# Patient Record
Sex: Female | Born: 1935
Health system: Southern US, Community
[De-identification: ages and names within clinical notes are randomized; demographics above are authoritative.]

## PROBLEM LIST (undated history)

## (undated) DIAGNOSIS — C50919 Malignant neoplasm of unspecified site of unspecified female breast: Secondary | ICD-10-CM

## (undated) DIAGNOSIS — Z853 Personal history of malignant neoplasm of breast: Secondary | ICD-10-CM

## (undated) DIAGNOSIS — E785 Hyperlipidemia, unspecified: Secondary | ICD-10-CM

## (undated) DIAGNOSIS — C801 Malignant (primary) neoplasm, unspecified: Secondary | ICD-10-CM

## (undated) DIAGNOSIS — G4730 Sleep apnea, unspecified: Secondary | ICD-10-CM

## (undated) DIAGNOSIS — I10 Essential (primary) hypertension: Secondary | ICD-10-CM

## (undated) DIAGNOSIS — G2581 Restless legs syndrome: Secondary | ICD-10-CM

## (undated) DIAGNOSIS — L409 Psoriasis, unspecified: Secondary | ICD-10-CM

## (undated) DIAGNOSIS — K219 Gastro-esophageal reflux disease without esophagitis: Secondary | ICD-10-CM

## (undated) DIAGNOSIS — G709 Myoneural disorder, unspecified: Secondary | ICD-10-CM

## (undated) HISTORY — DX: Restless legs syndrome: G25.81

## (undated) HISTORY — DX: Myoneural disorder, unspecified: G70.9

## (undated) HISTORY — DX: Malignant (primary) neoplasm, unspecified: C80.1

## (undated) HISTORY — PX: TUBAL LIGATION: SHX77

## (undated) HISTORY — DX: Gastro-esophageal reflux disease without esophagitis: K21.9

## (undated) HISTORY — DX: Hyperlipidemia, unspecified: E78.5

## (undated) HISTORY — DX: Personal history of malignant neoplasm of breast: Z85.3

## (undated) HISTORY — DX: Essential (primary) hypertension: I10

---

## 1984-04-30 HISTORY — PX: APPENDECTOMY: SHX54

## 1998-01-17 ENCOUNTER — Ambulatory Visit (HOSPITAL_COMMUNITY): Admission: RE | Admit: 1998-01-17 | Discharge: 1998-01-17 | Payer: Self-pay | Admitting: Family Medicine

## 1998-05-31 ENCOUNTER — Ambulatory Visit (HOSPITAL_COMMUNITY): Admission: RE | Admit: 1998-05-31 | Discharge: 1998-05-31 | Payer: Self-pay | Admitting: Family Medicine

## 1999-03-03 ENCOUNTER — Ambulatory Visit (HOSPITAL_COMMUNITY): Admission: RE | Admit: 1999-03-03 | Discharge: 1999-03-03 | Payer: Self-pay | Admitting: Family Medicine

## 1999-03-03 ENCOUNTER — Encounter: Payer: Self-pay | Admitting: Family Medicine

## 2000-04-03 ENCOUNTER — Encounter: Admission: RE | Admit: 2000-04-03 | Discharge: 2000-04-03 | Payer: Self-pay | Admitting: Family Medicine

## 2000-04-03 ENCOUNTER — Encounter: Payer: Self-pay | Admitting: Family Medicine

## 2001-06-11 ENCOUNTER — Encounter: Payer: Self-pay | Admitting: Family Medicine

## 2001-06-11 ENCOUNTER — Encounter: Admission: RE | Admit: 2001-06-11 | Discharge: 2001-06-11 | Payer: Self-pay | Admitting: Family Medicine

## 2002-07-08 ENCOUNTER — Encounter: Admission: RE | Admit: 2002-07-08 | Discharge: 2002-07-08 | Payer: Self-pay | Admitting: Family Medicine

## 2002-07-08 ENCOUNTER — Encounter: Payer: Self-pay | Admitting: Family Medicine

## 2003-08-30 ENCOUNTER — Encounter: Admission: RE | Admit: 2003-08-30 | Discharge: 2003-08-30 | Payer: Self-pay | Admitting: Family Medicine

## 2004-09-22 ENCOUNTER — Encounter: Admission: RE | Admit: 2004-09-22 | Discharge: 2004-09-22 | Payer: Self-pay | Admitting: Family Medicine

## 2005-03-07 ENCOUNTER — Encounter: Admission: RE | Admit: 2005-03-07 | Discharge: 2005-03-07 | Payer: Self-pay | Admitting: Family Medicine

## 2005-10-18 ENCOUNTER — Encounter: Admission: RE | Admit: 2005-10-18 | Discharge: 2005-10-18 | Payer: Self-pay | Admitting: Family Medicine

## 2005-11-01 ENCOUNTER — Encounter: Admission: RE | Admit: 2005-11-01 | Discharge: 2005-11-01 | Payer: Self-pay | Admitting: Family Medicine

## 2006-03-12 ENCOUNTER — Encounter: Admission: RE | Admit: 2006-03-12 | Discharge: 2006-03-12 | Payer: Self-pay | Admitting: Family Medicine

## 2006-04-30 HISTORY — PX: BREAST LUMPECTOMY: SHX2

## 2006-04-30 HISTORY — PX: BREAST EXCISIONAL BIOPSY: SUR124

## 2006-09-25 ENCOUNTER — Emergency Department (HOSPITAL_COMMUNITY): Admission: EM | Admit: 2006-09-25 | Discharge: 2006-09-25 | Payer: Self-pay | Admitting: Emergency Medicine

## 2006-12-24 ENCOUNTER — Encounter: Admission: RE | Admit: 2006-12-24 | Discharge: 2006-12-24 | Payer: Self-pay | Admitting: Family Medicine

## 2007-01-06 ENCOUNTER — Encounter (INDEPENDENT_AMBULATORY_CARE_PROVIDER_SITE_OTHER): Payer: Self-pay | Admitting: Diagnostic Radiology

## 2007-01-06 ENCOUNTER — Encounter: Admission: RE | Admit: 2007-01-06 | Discharge: 2007-01-06 | Payer: Self-pay | Admitting: Family Medicine

## 2007-01-06 DIAGNOSIS — Z853 Personal history of malignant neoplasm of breast: Secondary | ICD-10-CM | POA: Insufficient documentation

## 2007-01-06 HISTORY — DX: Personal history of malignant neoplasm of breast: Z85.3

## 2007-01-13 ENCOUNTER — Encounter: Admission: RE | Admit: 2007-01-13 | Discharge: 2007-01-13 | Payer: Self-pay | Admitting: Family Medicine

## 2007-01-31 ENCOUNTER — Encounter: Admission: RE | Admit: 2007-01-31 | Discharge: 2007-01-31 | Payer: Self-pay | Admitting: Surgery

## 2007-02-01 ENCOUNTER — Encounter: Admission: RE | Admit: 2007-02-01 | Discharge: 2007-02-01 | Payer: Self-pay | Admitting: Surgery

## 2007-02-04 ENCOUNTER — Encounter (INDEPENDENT_AMBULATORY_CARE_PROVIDER_SITE_OTHER): Payer: Self-pay | Admitting: Surgery

## 2007-02-04 ENCOUNTER — Encounter: Admission: RE | Admit: 2007-02-04 | Discharge: 2007-02-04 | Payer: Self-pay | Admitting: Surgery

## 2007-02-04 ENCOUNTER — Ambulatory Visit (HOSPITAL_BASED_OUTPATIENT_CLINIC_OR_DEPARTMENT_OTHER): Admission: RE | Admit: 2007-02-04 | Discharge: 2007-02-04 | Payer: Self-pay | Admitting: Surgery

## 2007-02-04 HISTORY — PX: BREAST LUMPECTOMY W/ NEEDLE LOCALIZATION: SHX1266

## 2007-02-07 ENCOUNTER — Ambulatory Visit: Payer: Self-pay | Admitting: Oncology

## 2007-02-26 LAB — COMPREHENSIVE METABOLIC PANEL
ALT: 22 U/L (ref 0–35)
AST: 20 U/L (ref 0–37)
Calcium: 9.4 mg/dL (ref 8.4–10.5)
Chloride: 99 mEq/L (ref 96–112)
Creatinine, Ser: 0.75 mg/dL (ref 0.40–1.20)
Sodium: 137 mEq/L (ref 135–145)
Total Protein: 7.1 g/dL (ref 6.0–8.3)

## 2007-02-26 LAB — CBC WITH DIFFERENTIAL/PLATELET
BASO%: 0.4 % (ref 0.0–2.0)
EOS%: 2.1 % (ref 0.0–7.0)
HCT: 43 % (ref 34.8–46.6)
MCH: 33.1 pg (ref 26.0–34.0)
MCHC: 36.2 g/dL — ABNORMAL HIGH (ref 32.0–36.0)
MONO#: 0.5 10*3/uL (ref 0.1–0.9)
NEUT%: 61.3 % (ref 39.6–76.8)
RBC: 4.7 10*6/uL (ref 3.70–5.32)
RDW: 12.7 % (ref 11.3–14.5)
WBC: 6.2 10*3/uL (ref 3.9–10.0)
lymph#: 1.8 10*3/uL (ref 0.9–3.3)

## 2007-03-02 LAB — VITAMIN D PNL(25-HYDRXY+1,25-DIHY)-BLD
Vit D, 1,25-Dihydroxy: 37 pg/mL (ref 6–62)
Vit D, 25-Hydroxy: 32 ng/mL (ref 30–89)

## 2007-03-04 ENCOUNTER — Ambulatory Visit (HOSPITAL_COMMUNITY): Admission: RE | Admit: 2007-03-04 | Discharge: 2007-03-04 | Payer: Self-pay | Admitting: Oncology

## 2007-03-14 ENCOUNTER — Ambulatory Visit: Admission: RE | Admit: 2007-03-14 | Discharge: 2007-04-30 | Payer: Self-pay | Admitting: Radiation Oncology

## 2007-05-01 ENCOUNTER — Ambulatory Visit: Admission: RE | Admit: 2007-05-01 | Discharge: 2007-05-19 | Payer: Self-pay | Admitting: Radiation Oncology

## 2007-05-12 ENCOUNTER — Encounter: Admission: RE | Admit: 2007-05-12 | Discharge: 2007-05-12 | Payer: Self-pay | Admitting: Oncology

## 2007-05-15 ENCOUNTER — Ambulatory Visit: Payer: Self-pay | Admitting: Oncology

## 2007-06-04 LAB — CBC WITH DIFFERENTIAL/PLATELET
BASO%: 0.1 % (ref 0.0–2.0)
EOS%: 1 % (ref 0.0–7.0)
LYMPH%: 16.2 % (ref 14.0–48.0)
MCHC: 34.5 g/dL (ref 32.0–36.0)
MCV: 91.9 fL (ref 81.0–101.0)
MONO%: 6.7 % (ref 0.0–13.0)
NEUT#: 5.3 10*3/uL (ref 1.5–6.5)
Platelets: 177 10*3/uL (ref 145–400)
RBC: 5.03 10*6/uL (ref 3.70–5.32)
RDW: 12.5 % (ref 11.3–14.5)

## 2007-06-05 LAB — CANCER ANTIGEN 27.29: CA 27.29: 14 U/mL (ref 0–39)

## 2007-06-05 LAB — COMPREHENSIVE METABOLIC PANEL
ALT: 12 U/L (ref 0–35)
AST: 15 U/L (ref 0–37)
Alkaline Phosphatase: 62 U/L (ref 39–117)
Creatinine, Ser: 0.87 mg/dL (ref 0.40–1.20)
Sodium: 138 mEq/L (ref 135–145)
Total Bilirubin: 0.8 mg/dL (ref 0.3–1.2)
Total Protein: 7.4 g/dL (ref 6.0–8.3)

## 2007-06-05 LAB — VITAMIN D 25 HYDROXY (VIT D DEFICIENCY, FRACTURES): Vit D, 25-Hydroxy: 37 ng/mL (ref 30–89)

## 2007-06-05 LAB — LACTATE DEHYDROGENASE: LDH: 144 U/L (ref 94–250)

## 2007-06-09 LAB — VITAMIN D 1,25 DIHYDROXY: Vit D, 1,25-Dihydroxy: 39 pg/mL (ref 6–62)

## 2007-09-12 ENCOUNTER — Ambulatory Visit: Payer: Self-pay | Admitting: Oncology

## 2007-09-17 ENCOUNTER — Encounter: Admission: RE | Admit: 2007-09-17 | Discharge: 2007-09-17 | Payer: Self-pay | Admitting: Family Medicine

## 2007-09-19 ENCOUNTER — Ambulatory Visit (HOSPITAL_COMMUNITY): Admission: RE | Admit: 2007-09-19 | Discharge: 2007-09-19 | Payer: Self-pay | Admitting: Oncology

## 2007-10-03 ENCOUNTER — Encounter (INDEPENDENT_AMBULATORY_CARE_PROVIDER_SITE_OTHER): Payer: Self-pay | Admitting: Interventional Radiology

## 2007-10-03 ENCOUNTER — Ambulatory Visit (HOSPITAL_COMMUNITY): Admission: RE | Admit: 2007-10-03 | Discharge: 2007-10-03 | Payer: Self-pay | Admitting: Oncology

## 2008-01-07 ENCOUNTER — Encounter: Admission: RE | Admit: 2008-01-07 | Discharge: 2008-01-07 | Payer: Self-pay | Admitting: Oncology

## 2008-03-15 IMAGING — CT NM PET TUM IMG SKULL BASE T - THIGH
6 series · 25 of 25 positions shown · IV contrast (350 OM)
Comparison: CT of the abdomen 02/01/2007

CLINICAL DATA: Newly diagnosed breast cancer. Evaluate liver masses.

FDG PET-CT TUMOR IMAGING (SKULL BASE TO THIGHS):
Fasting Blood Glucose:  107
TECHNIQUE: 17.4 mCi F-18 FDG was injected intravenously via the right
antecubital fossa .  Full-ring PET imaging was performed from the skull base
through the mid-thighs 55 minutes after injection.  CT data was obtained and
used for attenuation correction and anatomic localization only.  (This was not
acquired as a diagnostic CT examination.)

[Series 1: pet ac · axial · 3.3mm · 4.69mm/px · z∈[-870,+0]mm · 5 of 267 slices shown]
[im 1/267]
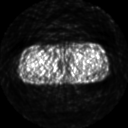
[im 67/267]
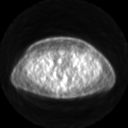
[im 134/267]
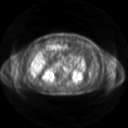
[im 200/267]
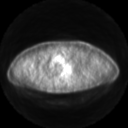
[im 267/267]
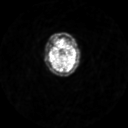

[Series 2: ct images · axial · 3.8mm · 0.98mm/px · z∈[-870,+0]mm · 5 of 266 slices shown]
[im 1/266]
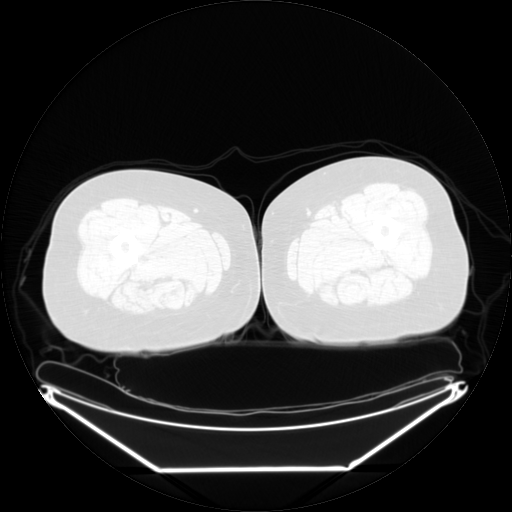
[im 67/266]
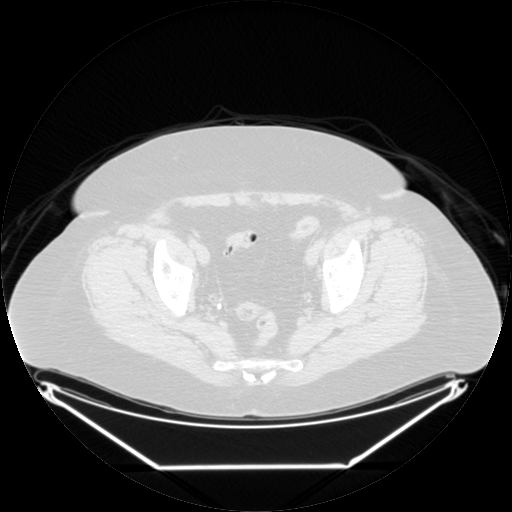
[im 133/266]
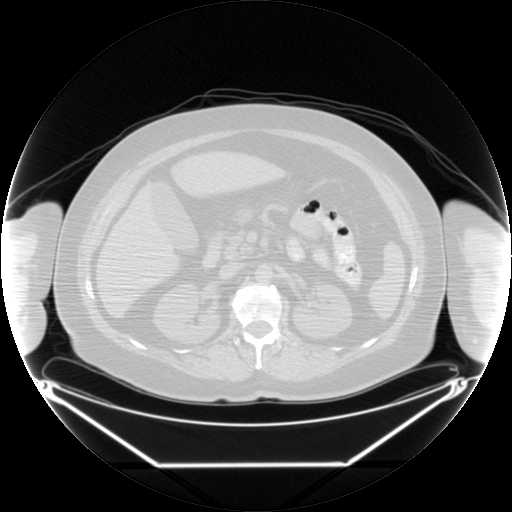
[im 199/266]
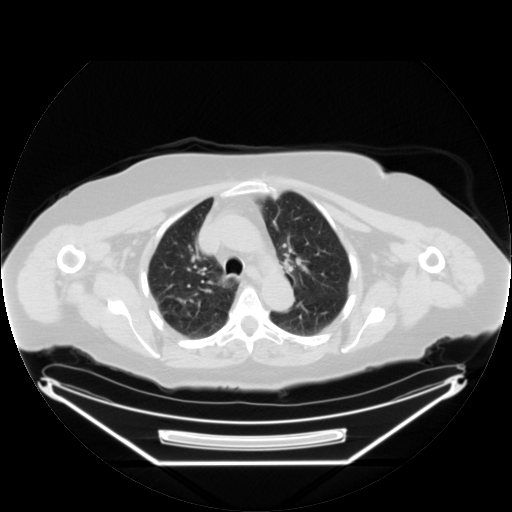
[im 266/266  brain]
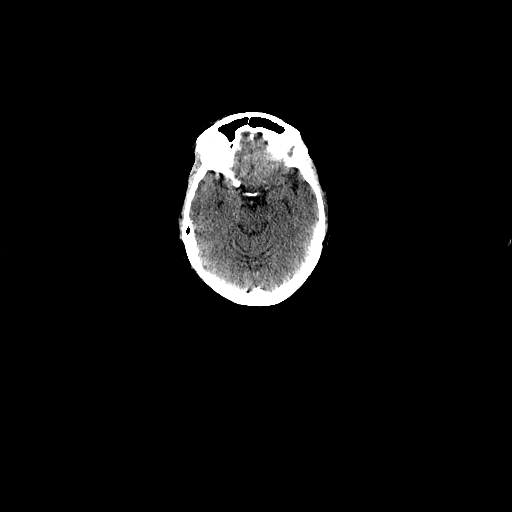

[Series 2: pet nac · axial · 3.3mm · 4.69mm/px · z∈[-870,+0]mm · 6 of 267 slices shown]
[im 1/267]
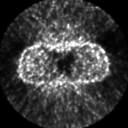
[im 54/267]
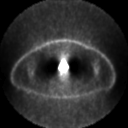
[im 107/267]
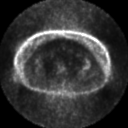
[im 160/267]
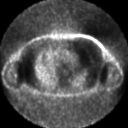
[im 213/267]
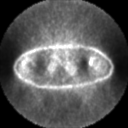
[im 267/267]
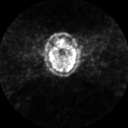

[Series 123: mip · coronal · 3.3mm · 4.69mm/px · 1 of 30 slices shown]
[im 1/30]
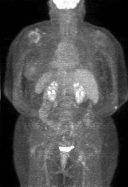

[Series 151: reformatted · axial · 3.3mm · 3.91mm/px · z∈[-870,+0]mm · 6 of 265 slices shown (1 of 2)]
[im 1/265]
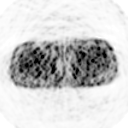
[im 53/265]
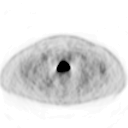
[im 106/265]
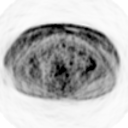
[im 159/265]
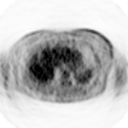
[im 212/265]
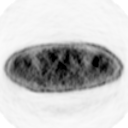
[im 265/265]
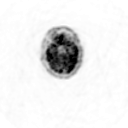

[Series 153: reformatted · coronal · 4.7mm · 6.98mm/px · 2 of 73 slices shown (2 of 2)]
[im 1/73]
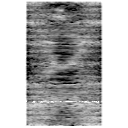
[im 73/73]
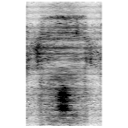

[25 of 25 positions shown; findings below may reference images not displayed]

FINDINGS: Again seen are partially calcified masses within the liver. There is
no significant FDG accumulation in these areas of the liver. Fluid collection
seen within the left breast, likely post operative hematoma or seroma, with low
level activity, likely postoperative change. Mild increased activity around the
left humeral head diffusely, likely left shoulder synovitis/inflammatory
arthropathy.

Otherwise, no areas of abnormal metabolic activity seen within the neck, chest,
abdomen, or pelvis.
IMPRESSION: No significant FDG accumulation in the partially calcified masses within the
liver. Therefore these are most likely benign. However, low grade mucinous
adenocarcinoma still cannot be completely excluded, and I would recommend close
interval followup to assure stability of these masses.

Increased activity within the left shoulder joint, likely synovitis/inflammatory
arthropathy.

Postoperative changes in the left breast.

## 2008-04-11 ENCOUNTER — Encounter: Admission: RE | Admit: 2008-04-11 | Discharge: 2008-04-11 | Payer: Self-pay | Admitting: Family Medicine

## 2008-05-13 ENCOUNTER — Encounter: Admission: RE | Admit: 2008-05-13 | Discharge: 2008-05-13 | Payer: Self-pay | Admitting: *Deleted

## 2008-05-27 ENCOUNTER — Encounter: Admission: RE | Admit: 2008-05-27 | Discharge: 2008-05-27 | Payer: Self-pay | Admitting: Family Medicine

## 2008-09-13 ENCOUNTER — Ambulatory Visit: Payer: Self-pay | Admitting: Oncology

## 2008-09-15 LAB — COMPREHENSIVE METABOLIC PANEL WITH GFR
ALT: 10 U/L (ref 0–35)
AST: 15 U/L (ref 0–37)
Albumin: 4.1 g/dL (ref 3.5–5.2)
Alkaline Phosphatase: 43 U/L (ref 39–117)
BUN: 19 mg/dL (ref 6–23)
CO2: 26 meq/L (ref 19–32)
Calcium: 9.1 mg/dL (ref 8.4–10.5)
Chloride: 103 meq/L (ref 96–112)
Creatinine, Ser: 0.86 mg/dL (ref 0.40–1.20)
Glucose, Bld: 113 mg/dL — ABNORMAL HIGH (ref 70–99)
Potassium: 4.3 meq/L (ref 3.5–5.3)
Sodium: 138 meq/L (ref 135–145)
Total Bilirubin: 0.5 mg/dL (ref 0.3–1.2)
Total Protein: 6.4 g/dL (ref 6.0–8.3)

## 2008-09-15 LAB — CBC WITH DIFFERENTIAL/PLATELET
Basophils Absolute: 0 10*3/uL (ref 0.0–0.1)
EOS%: 2.2 % (ref 0.0–7.0)
Eosinophils Absolute: 0.1 10*3/uL (ref 0.0–0.5)
HGB: 13.8 g/dL (ref 11.6–15.9)
MONO%: 6.3 % (ref 0.0–14.0)
NEUT#: 3.7 10*3/uL (ref 1.5–6.5)
RBC: 4.17 10*6/uL (ref 3.70–5.45)
RDW: 11.7 % (ref 11.2–14.5)
lymph#: 1.8 10*3/uL (ref 0.9–3.3)

## 2008-09-22 ENCOUNTER — Ambulatory Visit (HOSPITAL_COMMUNITY): Admission: RE | Admit: 2008-09-22 | Discharge: 2008-09-22 | Payer: Self-pay | Admitting: Oncology

## 2009-01-07 ENCOUNTER — Encounter: Admission: RE | Admit: 2009-01-07 | Discharge: 2009-01-07 | Payer: Self-pay | Admitting: Family Medicine

## 2009-09-14 ENCOUNTER — Ambulatory Visit: Payer: Self-pay | Admitting: Oncology

## 2009-09-15 ENCOUNTER — Ambulatory Visit (HOSPITAL_COMMUNITY): Admission: RE | Admit: 2009-09-15 | Discharge: 2009-09-15 | Payer: Self-pay | Admitting: Oncology

## 2009-09-15 LAB — CBC WITH DIFFERENTIAL/PLATELET
BASO%: 0.5 % (ref 0.0–2.0)
Eosinophils Absolute: 0.1 10*3/uL (ref 0.0–0.5)
LYMPH%: 28.4 % (ref 14.0–49.7)
MCHC: 35.7 g/dL (ref 31.5–36.0)
MCV: 91.8 fL (ref 79.5–101.0)
MONO%: 8.2 % (ref 0.0–14.0)
NEUT%: 60.3 % (ref 38.4–76.8)
Platelets: 153 10*3/uL (ref 145–400)
RBC: 4.58 10*6/uL (ref 3.70–5.45)

## 2009-09-15 LAB — COMPREHENSIVE METABOLIC PANEL
Alkaline Phosphatase: 52 U/L (ref 39–117)
Creatinine, Ser: 0.76 mg/dL (ref 0.40–1.20)
Glucose, Bld: 117 mg/dL — ABNORMAL HIGH (ref 70–99)
Sodium: 141 mEq/L (ref 135–145)
Total Bilirubin: 0.8 mg/dL (ref 0.3–1.2)
Total Protein: 6.8 g/dL (ref 6.0–8.3)

## 2009-09-15 LAB — CEA: CEA: 2.2 ng/mL (ref 0.0–5.0)

## 2010-01-09 ENCOUNTER — Encounter: Admission: RE | Admit: 2010-01-09 | Discharge: 2010-01-09 | Payer: Self-pay | Admitting: Oncology

## 2010-01-16 ENCOUNTER — Encounter: Admission: RE | Admit: 2010-01-16 | Discharge: 2010-01-16 | Payer: Self-pay | Admitting: Family Medicine

## 2010-05-19 ENCOUNTER — Other Ambulatory Visit: Payer: Self-pay | Admitting: Oncology

## 2010-05-19 DIAGNOSIS — Z853 Personal history of malignant neoplasm of breast: Secondary | ICD-10-CM

## 2010-05-22 ENCOUNTER — Encounter: Payer: Self-pay | Admitting: Oncology

## 2010-09-12 NOTE — Op Note (Signed)
NAMEVERGENE, Johnston                 ACCOUNT NO.:  [id number]   MEDICAL RECORD NO.:  [id number]          PATIENT TYPE:  AMB   LOCATION:  [facility]SC                          FACILITY:  [facility]   PHYSICIAN:  Currie Paris, M.D.DATE OF BIRTH:  Sep 19, 1933   DATE OF PROCEDURE:  02/04/2007  DATE OF DISCHARGE:                               OPERATIVE REPORT   OFFICE MEDICAL RECORD NUMBER [facility] [phone].   PREOPERATIVE DIAGNOSIS:  Carcinoma, left breast upper outer quadrant.   POSTOPERATIVE DIAGNOSIS:  Carcinoma, left breast upper outer quadrant.   OPERATION:  Needle-localized left lumpectomy with blue dye injection and  sentinel lymph node biopsy (two nodes).   SURGEON:  Currie Paris, M.D.   ANESTHESIA:  General.   CLINICAL HISTORY:  Paige Johnston is a 77 year old lady recently found to  have a left breast cancer.  There was an extensive area of abnormality  on the mammogram and MRI but it was not known to much of this  represented post-biopsy change.  We elected to proceed to a wide  excision with the recognition that if we were unable to achieve negative  margins, she might need either a wider excision or a mastectomy.  In  addition, preoperative evaluation had shown a mass in the liver which  was suspicious for some form of metastatic disease but with  calcifications in that mass, it was thought not likely to be breast.   DESCRIPTION OF PROCEDURE:  The patient was seen in the holding area and  she had no further questions.  We reviewed the plans for surgery and I  reviewed the mammogram films and we both initialed the left breast as  the operative side.   The patient was taken to the operating room and after satisfactory  general anesthesia had been obtained, the time-out was done.  The left  breast was prepped with some alcohol around the nipple-areolar area and  injected with 5 mL of dilute methylene blue for the sentinel node  biopsy.   The entire breast was prepped and draped as a  sterile field.  The  guidewire itself entered fairly lateral in the upper outer quadrant and  tracked somewhat towards the nipple but above it.  I went ahead and made  a curvilinear incision between the guidewire entry point and the areolar  margin.  I divided about a centimeter of breast tissue, then elevated a  skin flap over to the guidewire and manipulated that into the wound.  I  freed up the breast tissue using cautery on either side of the guidewire  both superior, inferior, and then somewhat lateral and posterior.  Once  that was done I could grasp the tissue on either side of the guidewire  with an Allis and using that for traction continued the excision along  the tract of the guidewire until I reached the area the subareolar  tissue so hat I got excision all the way over to that area medially.   I made sure everything was dry, placed a pack and turned my attention to  the axilla.   Using  the Neoprobe I identified a hot area and made a transverse  axillary incision.  I divided the subcutaneous tissues down to axillary  fat.  Using the Neoprobe to direct my search, I found a hot area and  with a brief dissection found a blue node, which was grasped and  excised.  I used cautery to remove it.  It had counts up to 2000.   Using the Neoprobe I found another hot area that had counts of around  300.  This was a little more towards the chest wall and this was excised  and was found to be a second, smaller node.   With those two nodes out I saw no other blue dye or blue nodes, there  were no palpably abnormal nodes and counts descended to about 0-5.   I went ahead and put some Marcaine in here and closed this incision  layers with 3-0 Vicryl and 5-0 Monocryl subcuticular and finally  Dermabond.   The breast was checked again for hemostasis and I infiltrated some  Marcaine here and closed in a similar fashion.   Dr. Luisa Hart called and reported that the two lymph nodes were  negative  for metastatic disease and that the margins on the lumpectomy specimen  were negative and grossly as well around the tumor.   This completed the case.  The patient tolerated the procedure well.  There were no operative complications.  All counts were correct.      Currie Paris, M.D.  Electronically Signed     CJS/MEDQ  D:  02/04/2007  T:  02/04/2007  Job:  [id number]   cc:   Quita Skye. Artis Flock, M.D.

## 2010-09-14 ENCOUNTER — Ambulatory Visit (HOSPITAL_COMMUNITY)
Admission: RE | Admit: 2010-09-14 | Discharge: 2010-09-14 | Disposition: A | Discharge status: Home / Self Care | Payer: Medicare Other | Source: Ambulatory Visit | Attending: Oncology | Admitting: Oncology

## 2010-09-14 ENCOUNTER — Other Ambulatory Visit: Payer: Self-pay | Admitting: Oncology

## 2010-09-14 ENCOUNTER — Encounter (HOSPITAL_BASED_OUTPATIENT_CLINIC_OR_DEPARTMENT_OTHER): Payer: Medicare Other | Admitting: Oncology

## 2010-09-14 DIAGNOSIS — Z853 Personal history of malignant neoplasm of breast: Secondary | ICD-10-CM

## 2010-09-14 DIAGNOSIS — C50919 Malignant neoplasm of unspecified site of unspecified female breast: Secondary | ICD-10-CM | POA: Insufficient documentation

## 2010-09-14 DIAGNOSIS — Z170 Estrogen receptor positive status [ER+]: Secondary | ICD-10-CM

## 2010-09-14 DIAGNOSIS — C787 Secondary malignant neoplasm of liver and intrahepatic bile duct: Secondary | ICD-10-CM | POA: Insufficient documentation

## 2010-09-14 DIAGNOSIS — C50419 Malignant neoplasm of upper-outer quadrant of unspecified female breast: Secondary | ICD-10-CM

## 2010-09-14 LAB — CANCER ANTIGEN 27.29: CA 27.29: 19 U/mL (ref 0–39)

## 2010-09-14 LAB — CBC WITH DIFFERENTIAL/PLATELET
Basophils Absolute: 0 10*3/uL (ref 0.0–0.1)
Eosinophils Absolute: 0.1 10*3/uL (ref 0.0–0.5)
HCT: 40.5 % (ref 34.8–46.6)
HGB: 14.2 g/dL (ref 11.6–15.9)
MCH: 32.5 pg (ref 25.1–34.0)
MONO#: 0.4 10*3/uL (ref 0.1–0.9)
NEUT#: 3.4 10*3/uL (ref 1.5–6.5)
NEUT%: 65.3 % (ref 38.4–76.8)
lymph#: 1.3 10*3/uL (ref 0.9–3.3)

## 2010-09-14 LAB — CMP (CANCER CENTER ONLY)
Albumin: 3.7 g/dL (ref 3.3–5.5)
BUN, Bld: 27 mg/dL — ABNORMAL HIGH (ref 7–22)
CO2: 28 mEq/L (ref 18–33)
Calcium: 9.1 mg/dL (ref 8.0–10.3)
Chloride: 98 mEq/L (ref 98–108)
Creat: 0.9 mg/dl (ref 0.6–1.2)
Glucose, Bld: 147 mg/dL — ABNORMAL HIGH (ref 73–118)
Potassium: 4.3 mEq/L (ref 3.3–4.7)

## 2010-09-14 MED ORDER — GADOBENATE DIMEGLUMINE 529 MG/ML IV SOLN
18.0000 mL | Freq: Once | INTRAVENOUS | Status: AC | PRN
  Administered 2010-09-14: 18 mL via INTRAVENOUS

## 2010-09-21 ENCOUNTER — Encounter (HOSPITAL_BASED_OUTPATIENT_CLINIC_OR_DEPARTMENT_OTHER): Payer: Medicare Other | Admitting: Oncology

## 2010-09-21 DIAGNOSIS — D0590 Unspecified type of carcinoma in situ of unspecified breast: Secondary | ICD-10-CM

## 2010-09-21 DIAGNOSIS — Z170 Estrogen receptor positive status [ER+]: Secondary | ICD-10-CM

## 2010-09-22 ENCOUNTER — Encounter (INDEPENDENT_AMBULATORY_CARE_PROVIDER_SITE_OTHER): Payer: Self-pay | Admitting: Surgery

## 2010-12-12 ENCOUNTER — Other Ambulatory Visit: Payer: Self-pay | Admitting: Family Medicine

## 2010-12-12 DIAGNOSIS — Z853 Personal history of malignant neoplasm of breast: Secondary | ICD-10-CM

## 2011-01-11 ENCOUNTER — Ambulatory Visit
Admission: RE | Admit: 2011-01-11 | Discharge: 2011-01-11 | Disposition: A | Discharge status: Home / Self Care | Payer: Medicare Other | Source: Ambulatory Visit | Attending: Family Medicine | Admitting: Family Medicine

## 2011-01-11 DIAGNOSIS — Z853 Personal history of malignant neoplasm of breast: Secondary | ICD-10-CM

## 2011-01-25 LAB — PROTIME-INR
INR: 1
Prothrombin Time: 13

## 2011-01-25 LAB — CBC
Hemoglobin: 15.7 — ABNORMAL HIGH
RBC: 4.83
WBC: 6.6

## 2011-02-08 LAB — URINE MICROSCOPIC-ADD ON

## 2011-02-08 LAB — DIFFERENTIAL
Basophils Relative: 1
Eosinophils Relative: 3
Lymphocytes Relative: 32
Monocytes Absolute: 0.4
Monocytes Relative: 8
Neutro Abs: 2.8

## 2011-02-08 LAB — CBC
HCT: 43.2
Hemoglobin: 15.1 — ABNORMAL HIGH
WBC: 5

## 2011-02-08 LAB — URINALYSIS, ROUTINE W REFLEX MICROSCOPIC
Bilirubin Urine: NEGATIVE
Hgb urine dipstick: NEGATIVE
Ketones, ur: NEGATIVE
Specific Gravity, Urine: 1.026
pH: 6

## 2011-02-08 LAB — COMPREHENSIVE METABOLIC PANEL
AST: 29
Albumin: 3.8
Alkaline Phosphatase: 61
BUN: 19
Chloride: 102
GFR calc Af Amer: >60
Potassium: 3.9
Total Bilirubin: 1.3 — ABNORMAL HIGH
Total Protein: 6.9

## 2011-03-30 ENCOUNTER — Encounter (INDEPENDENT_AMBULATORY_CARE_PROVIDER_SITE_OTHER): Payer: Self-pay | Admitting: Surgery

## 2011-03-30 ENCOUNTER — Encounter (INDEPENDENT_AMBULATORY_CARE_PROVIDER_SITE_OTHER): Payer: Self-pay | Admitting: General Surgery

## 2011-04-03 ENCOUNTER — Encounter (INDEPENDENT_AMBULATORY_CARE_PROVIDER_SITE_OTHER): Payer: Self-pay | Admitting: Surgery

## 2011-04-03 ENCOUNTER — Ambulatory Visit (INDEPENDENT_AMBULATORY_CARE_PROVIDER_SITE_OTHER): Payer: Medicare Other | Admitting: Surgery

## 2011-04-03 VITALS — BP 108/78 | HR 60 | Temp 96.6°F | Resp 16 | Ht 64.0 in | Wt 188.1 lb

## 2011-04-03 DIAGNOSIS — Z853 Personal history of malignant neoplasm of breast: Secondary | ICD-10-CM

## 2011-04-03 NOTE — Progress Notes (Signed)
NAME: Paige Johnston       DOB: May 29, 1933           DATE: 04/03/2011       MRN: [id number]   KHALAYA MCGURN is a 77 y.o.[name] Kitchenfemale who presents for routine followup of her Left breast cancer diagnosed in 2008 and treated with lumpectomy, SLN, radiatioin and antiestrogen. She has no problems or concerns on either side.  PFSH: She has had no significant changes since the last visit here.  ROS: There have been no significant changes since the last visit here  EXAM: General: The patient is alert, oriented, generally healty appearing, NAD. Mood and affect are normal.  Breasts:  Right breast normal. Left slightly smaller, but soft and not tender with no mass or suspicious area  Lymphatics: She has no axillary or supraclavicular adenopathy on either side.  Extremities: Full ROM of the surgical side with no lymphedema noted.  Data Reviewed: Recent mammogram negative  Impression: Doing well, with no evidence of recurrent cancer or new cancer  Plan: Will continue to follow up on an annual basis here.

## 2011-06-25 ENCOUNTER — Telehealth: Payer: Self-pay | Admitting: *Deleted

## 2011-06-25 NOTE — Telephone Encounter (Signed)
patient confirmed over the phone the new date and time 

## 2011-08-27 ENCOUNTER — Other Ambulatory Visit (HOSPITAL_BASED_OUTPATIENT_CLINIC_OR_DEPARTMENT_OTHER): Payer: Medicare Other | Admitting: Lab

## 2011-08-27 ENCOUNTER — Ambulatory Visit (HOSPITAL_BASED_OUTPATIENT_CLINIC_OR_DEPARTMENT_OTHER): Payer: Medicare Other | Admitting: Oncology

## 2011-08-27 VITALS — BP 156/82 | HR 54 | Temp 97.7°F | Ht 64.0 in | Wt 186.6 lb

## 2011-08-27 DIAGNOSIS — C50919 Malignant neoplasm of unspecified site of unspecified female breast: Secondary | ICD-10-CM

## 2011-08-27 DIAGNOSIS — Z79810 Long term (current) use of selective estrogen receptor modulators (SERMs): Secondary | ICD-10-CM

## 2011-08-27 DIAGNOSIS — Z853 Personal history of malignant neoplasm of breast: Secondary | ICD-10-CM

## 2011-08-27 DIAGNOSIS — Z170 Estrogen receptor positive status [ER+]: Secondary | ICD-10-CM

## 2011-08-27 LAB — COMPREHENSIVE METABOLIC PANEL
ALT: 10 U/L (ref 0–35)
AST: 16 U/L (ref 0–37)
Albumin: 4.2 g/dL (ref 3.5–5.2)
Calcium: 9.4 mg/dL (ref 8.4–10.5)
Chloride: 101 mEq/L (ref 96–112)
Creatinine, Ser: 0.81 mg/dL (ref 0.50–1.10)
Potassium: 4.2 mEq/L (ref 3.5–5.3)
Sodium: 139 mEq/L (ref 135–145)
Total Protein: 6.3 g/dL (ref 6.0–8.3)

## 2011-08-27 LAB — CBC WITH DIFFERENTIAL/PLATELET
BASO%: 1 % (ref 0.0–2.0)
MCHC: 34.2 g/dL (ref 31.5–36.0)
MONO#: 0.5 10*3/uL (ref 0.1–0.9)
NEUT#: 2.8 10*3/uL (ref 1.5–6.5)
RBC: 4.45 10*6/uL (ref 3.70–5.45)
RDW: 12.5 % (ref 11.2–14.5)
WBC: 5.3 10*3/uL (ref 3.9–10.3)
lymph#: 1.9 10*3/uL (ref 0.9–3.3)
nRBC: 0 % (ref 0–0)

## 2011-08-27 MED ORDER — TAMOXIFEN CITRATE 20 MG PO TABS: 20.0000 mg | ORAL_TABLET | Freq: Every day | ORAL | Status: AC

## 2011-08-27 NOTE — Progress Notes (Signed)
ID: Juanetta Beets   DOB: 09-07-1933  MR#: [id number]  WUJ#:[id number]   INTERVAL HISTORY: Makynzie returns today for routine followup of her breast cancer. The interval history is unremarkable. Most afternoons she picks up her 76 year old granddaughter and grandson and keeps M. at home while they do their homework. Her son Kathlene November of course works at the 4 is indicative busy during the day. Her daughter-in-law Babette Relic is a Education officer, community and of course she also works most days.  REVIEW OF SYSTEMS: Shakiyah is working hard on her diabetes, and she goes to the gym just about every day and walks at least 2 miles there. She enjoys that. She describes herself as mildly fatigued. She has some ringing in your ears, mild sinus symptoms, and some pain associated with her prior lumpectomy site. Sometimes she has a little bit of urinary leakage when she stands up from urinating. Of course she has her sciatica psoriasis and diabetes problems as before. Otherwise a detailed review of systems was entirely negative, and she is tolerating tamoxifen with no side effects that she is aware of.  PAST MEDICAL HISTORY: Past Medical History  Diagnosis Date  . Hypertension   . Diabetes mellitus   . History of breast cancer   . Restless leg   . Cancer     breast  . GERD (gastroesophageal reflux disease)   . Hyperlipidemia   . Neuromuscular disorder     sciatica  . hx: breast cancer, left UOQ, invasive ductal carcinoma, receptor + her 2 - 01/06/2007    PAST SURGICAL HISTORY: Past Surgical History  Procedure Date  . Breast lumpectomy w/ needle localization 02/04/2007    left breast cancer  . Appendectomy 1986    TAH   . Tubal ligation 1970s    FAMILY HISTORY Family History  Problem Relation Age of Onset  . Stroke Mother   . Heart disease Father     HEALTH MAINTENANCE: History  Substance Use Topics  . Smoking status: Never Smoker   . Smokeless tobacco: Never Used  . Alcohol Use: Yes     very seldom      Colonoscopy:  PAP:  Bone density:  Lipid panel:  No Known Allergies  Current Outpatient Prescriptions  Medication Sig Dispense Refill  . aspirin 325 MG tablet Take 325 mg by mouth daily.        [name] Kitchen atenolol (TENORMIN) 25 MG tablet Take 25 mg by mouth daily.        . calcium carbonate (TUMS - DOSED IN MG ELEMENTAL CALCIUM) 500 MG chewable tablet Chew 1 tablet by mouth daily.        . Cyanocobalamin (VITAMIN B 12 PO) Take 100 mcg by mouth daily.        . famotidine (PEPCID AC) 10 MG chewable tablet Chew 10 mg by mouth as needed.        . fish oil-omega-3 fatty acids 1000 MG capsule Take 2 g by mouth daily.        [name] Kitchen gabapentin (NEURONTIN) 100 MG capsule Take 100 mg by mouth 3 (three) times daily.        [name] Kitchen glucose blood (FREESTYLE LITE) test strip 1 each by Other route as needed. Use as instructed       . Lancets (FREESTYLE) lancets 1 each by Other route as needed. Use as instructed       . losartan-hydrochlorothiazide (HYZAAR) 50-12.5 MG per tablet Take 1 tablet by mouth daily.        [name] Kitchen  Multiple Vitamin (MULTIVITAMIN) tablet Take 1 tablet by mouth daily.        . niacin (NIASPAN) 750 MG CR tablet Take 750 mg by mouth at bedtime.        [name] Kitchen omeprazole (PRILOSEC) 40 MG capsule       . tamoxifen (NOLVADEX) 20 MG tablet Take 20 mg by mouth daily.        . traMADol (ULTRAM) 50 MG tablet Take 50 mg by mouth 2 (two) times daily. Maximum dose= 8 tablets per day       . venlafaxine (EFFEXOR-XR) 75 MG 24 hr capsule         OBJECTIVE: Elderly white woman who appears well Filed Vitals:   08/27/11 1053  BP: 156/82  Pulse: 54  Temp: 97.7 F (36.5 C)     Body mass index is 32.03 kg/(m^2).    ECOG FS: 1  Sclerae unicteric Oropharynx clear No peripheral adenopathy Lungs no rales or rhonchi Heart regular rate and rhythm Abd benign MSK no focal spinal tenderness, no peripheral edema Neuro: nonfocal Breasts: The right breast is unremarkable; left breast is status post lumpectomy, no evidence  of local recurrence.  LAB RESULTS: Lab Results  Component Value Date   WBC 5.3 08/27/2011   NEUTROABS 2.8 08/27/2011   HGB 14.2 08/27/2011   HCT 41.4 08/27/2011   MCV 93.2 08/27/2011   PLT 144* 08/27/2011      Chemistry      Component Value Date/Time   NA 141 09/14/2010 1015   NA 141 09/15/2009 1000   K 4.3 09/14/2010 1015   K 4.4 09/15/2009 1000   CL 98 09/14/2010 1015   CL 105 09/15/2009 1000   CO2 28 09/14/2010 1015   CO2 30 09/15/2009 1000   BUN 27* 09/14/2010 1015   BUN 16 09/15/2009 1000   CREATININE 0.9 09/14/2010 1015   CREATININE 0.76 09/15/2009 1000      Component Value Date/Time   CALCIUM 9.1 09/14/2010 1015   CALCIUM 9.2 09/15/2009 1000   ALKPHOS 35 09/14/2010 1015   ALKPHOS 52 09/15/2009 1000   AST 25 09/14/2010 1015   AST 19 09/15/2009 1000   ALT 13 09/15/2009 1000   BILITOT 0.70 09/14/2010 1015   BILITOT 0.8 09/15/2009 1000       Lab Results  Component Value Date   LABCA2 19 09/14/2010    No components found with this basename: [id number]    No results found for this basename: INR:1;PROTIME:1 in the last 168 hours  Urinalysis    Component Value Date/Time   COLORURINE YELLOW 01/31/2007 0955   APPEARANCEUR CLOUDY* 01/31/2007 0955   LABSPEC 1.026 01/31/2007 0955   PHURINE 6.0 01/31/2007 0955   GLUCOSEU NEGATIVE 01/31/2007 0955   HGBUR NEGATIVE 01/31/2007 0955   BILIRUBINUR NEGATIVE 01/31/2007 0955   KETONESUR NEGATIVE 01/31/2007 0955   PROTEINUR NEGATIVE 01/31/2007 0955   UROBILINOGEN 1.0 01/31/2007 0955   NITRITE NEGATIVE 01/31/2007 0955   LEUKOCYTESUR MODERATE* 01/31/2007 0955    STUDIES: No new results found. Next mammogram due September of this year  ASSESSMENT: 78 year old [place] woman status post left lumpectomy and sentinel lymph node biopsy October 2008 for a 2-mm invasive ductal carcinoma in the setting of ductal carcinoma in situ.  The invasive tumor was grade 1, node negative, strongly estrogen and progesterone receptor positive, HercepTest negative, with a  low proliferation fraction.  After radiation completed January 2009, she started tamoxifen.   PLAN: She will complete 5 years of tamoxifen in January. I  have written her the appropriate prescription. She will see me again in mid January and at that point likely we will discontinue followup here. I am delighted that she continues to do so well. She knows to call for any problems that may develop before the next visit   MAGRINAT,GUSTAV C    08/27/2011

## 2011-08-28 ENCOUNTER — Telehealth: Payer: Self-pay | Admitting: *Deleted

## 2011-08-28 NOTE — Telephone Encounter (Signed)
gave patient appointment for 04-2012 printed out calendar and gave to the patient 

## 2011-09-05 ENCOUNTER — Other Ambulatory Visit: Payer: Self-pay | Admitting: Dermatology

## 2011-12-10 ENCOUNTER — Other Ambulatory Visit (INDEPENDENT_AMBULATORY_CARE_PROVIDER_SITE_OTHER): Payer: Self-pay | Admitting: Surgery

## 2011-12-10 DIAGNOSIS — Z853 Personal history of malignant neoplasm of breast: Secondary | ICD-10-CM

## 2012-01-14 ENCOUNTER — Ambulatory Visit
Admission: RE | Admit: 2012-01-14 | Discharge: 2012-01-14 | Disposition: A | Discharge status: Home / Self Care | Payer: Medicare Other | Source: Ambulatory Visit | Attending: Surgery | Admitting: Surgery

## 2012-01-14 DIAGNOSIS — Z853 Personal history of malignant neoplasm of breast: Secondary | ICD-10-CM

## 2012-04-04 ENCOUNTER — Encounter (INDEPENDENT_AMBULATORY_CARE_PROVIDER_SITE_OTHER): Payer: Self-pay | Admitting: Surgery

## 2012-04-04 ENCOUNTER — Ambulatory Visit (INDEPENDENT_AMBULATORY_CARE_PROVIDER_SITE_OTHER): Payer: Medicare Other | Admitting: Surgery

## 2012-04-04 VITALS — BP 122/84 | HR 64 | Temp 98.4°F | Resp 16 | Ht 65.0 in | Wt 186.0 lb

## 2012-04-04 DIAGNOSIS — Z853 Personal history of malignant neoplasm of breast: Secondary | ICD-10-CM

## 2012-04-04 NOTE — Patient Instructions (Signed)
Continue annual mammograms We will see you again on an as needed basis. Please call the office at [phone] if you have any questions or concerns. Thank you for allowing us to take care of you.  

## 2012-04-04 NOTE — Progress Notes (Signed)
NAME: [name]       DOB: 16-Sep-1933           DATE: 04/04/2012       MRN: [id number]   Paige Johnston is a 78 y.o.[name] Kitchenfemale who presents for routine followup of her Left breast cancer, IDC, Stage i, Receptor+, diagnosed in 2008 and treated with lumpectomy, SLN, radiatioin and antiestrogen. She has no problems or concerns on either side.  PFSH: She has had no significant changes since the last visit here.  ROS: There have been no significant changes since the last visit here  EXAM: General: The patient is alert, oriented, generally healty appearing, NAD. Mood and affect are normal.  Breasts:  Right breast normal. Left slightly smaller, but soft and not tender with no mass or suspicious area  Lymphatics: She has no axillary or supraclavicular adenopathy on either side.  Extremities: Full ROM of the surgical side with no lymphedema noted.  Data Reviewed: Recent mammogram : IMPRESSION:  Stable benign postoperative appearance  BI-RADS CATEGORY 2: Benign finding(s).  RECOMMENDATION:  Diagnostic bilateral mammogram in 1 year.  Original Report Authenticated By: Otilio Carpen, M.D.   Impression: Doing well, with no evidence of recurrent cancer or new cancer  Plan: RTC PRN Continue annual mammograms.

## 2012-05-05 ENCOUNTER — Other Ambulatory Visit (HOSPITAL_BASED_OUTPATIENT_CLINIC_OR_DEPARTMENT_OTHER): Payer: Medicare Other | Admitting: Lab

## 2012-05-05 DIAGNOSIS — Z853 Personal history of malignant neoplasm of breast: Secondary | ICD-10-CM

## 2012-05-05 DIAGNOSIS — C50419 Malignant neoplasm of upper-outer quadrant of unspecified female breast: Secondary | ICD-10-CM

## 2012-05-05 LAB — COMPREHENSIVE METABOLIC PANEL (CC13)
AST: 21 U/L (ref 5–34)
Alkaline Phosphatase: 46 U/L (ref 40–150)
BUN: 28 mg/dL — ABNORMAL HIGH (ref 7.0–26.0)
Creatinine: 1 mg/dL (ref 0.6–1.1)
Potassium: 4.3 mEq/L (ref 3.5–5.1)

## 2012-05-05 LAB — CBC WITH DIFFERENTIAL/PLATELET
Basophils Absolute: 0.1 10*3/uL (ref 0.0–0.1)
EOS%: 3.8 % (ref 0.0–7.0)
Eosinophils Absolute: 0.3 10*3/uL (ref 0.0–0.5)
HGB: 14.3 g/dL (ref 11.6–15.9)
LYMPH%: 28.9 % (ref 14.0–49.7)
MCH: 32.3 pg (ref 25.1–34.0)
MCHC: 34.2 g/dL (ref 31.5–36.0)
MCV: 94.3 fL (ref 79.5–101.0)
MONO%: 9.2 % (ref 0.0–14.0)
NEUT#: 3.8 10*3/uL (ref 1.5–6.5)
NEUT%: 56.9 % (ref 38.4–76.8)
Platelets: 172 10*3/uL (ref 145–400)
RDW: 12.6 % (ref 11.2–14.5)

## 2012-05-12 ENCOUNTER — Ambulatory Visit (HOSPITAL_BASED_OUTPATIENT_CLINIC_OR_DEPARTMENT_OTHER): Payer: Medicare Other | Admitting: Oncology

## 2012-05-12 VITALS — BP 142/88 | HR 77 | Temp 98.1°F | Resp 20 | Ht 65.0 in | Wt 191.8 lb

## 2012-05-12 DIAGNOSIS — Z853 Personal history of malignant neoplasm of breast: Secondary | ICD-10-CM

## 2012-05-12 NOTE — Progress Notes (Signed)
ID: Paige Johnston   DOB: [birth year]  MR#: [id number]  ONG#:[id number]  PCP: Asencion Partridge GYN: SU: Cicero Duck OTHER MD: Margaretmary Dys  HPI: [name] had her routine screening mammogram at the [facility] December 24, 2006, which showed new calcifications in the left breast.  Left diagnostic mammography on September 8 demonstrated a cluster of coarse calcifications in the left upper outer quadrants, which were biopsied on the same day.  The pathology ([id number] and [id number]) showed high-grade ductal carcinoma in situ with small foci of intermediate to high-grade invasive ductal carcinoma associated with extracellular mucin.  The tumor was strongly ER positive at 98%, PR positive at 78% with a low MIB1 at 9% and HercepTest negative at 1+.  Bilateral breast MRIs on January 13, 2007 showed essentially postbiopsy changes and some patchy enhancements measuring up to 4.7 cm.  There was no other evidence of disease.  As a side issue she had a preoperative chest x-ray October 3, which showed clear lungs, but some irregular calcifications in the right upper quadrant.  An abdominal CT was obtained October 4 to further evaluate this and this showed some partially calcified liver lesions of uncertain significance.    INTERVAL HISTORY: Paige Johnston returns today for routine followup of her breast cancer. The interval history is unremarkable. She picks up her grandchildren in the afternoon, does some housework of air well they do work, and they gets back to around the house. She has completed 5 years of tamoxifen with no significant side effects that she can report.  REVIEW OF SYSTEMS: She describes herself as a little fatigued, this is not a new issue, it is not worse than before, and this does not keep her from doing all her normal activities. She has a bit of a runny nose, but no significant sinus symptoms. She can be short of breath when she walks particularly up a hill. Just some urinary dribbling. Otherwise aside  from mild hot flashes and her diabetes problems a detailed review of systems today was noncontributory  PAST MEDICAL HISTORY: Past Medical History  Diagnosis Date  . Hypertension   . Diabetes mellitus   . History of breast cancer   . Restless leg   . Cancer     breast  . GERD (gastroesophageal reflux disease)   . Hyperlipidemia   . Neuromuscular disorder     sciatica  . hx: breast cancer, left UOQ, invasive ductal carcinoma, receptor + her 2 - 01/06/2007  Significant for status post tonsillectomy and adenoidectomy, status post bilateral tubal ligation, status post total abdominal hysterectomy with bilateral salpingo-oophorectomy, status post appendectomy, history of left rotator cuff tear, which will require surgery at some point, history of hypertension, history of diabetes which is currently diet controlled, history of GERD, history of osteoarthritis chiefly involving the lower back, history of hypercholesterolemia and history of eczema.  PAST SURGICAL HISTORY: Past Surgical History  Procedure Date  . Breast lumpectomy w/ needle localization 02/04/2007    left breast cancer  . Appendectomy 1986    TAH   . Tubal ligation 1970s    FAMILY HISTORY Family History  Problem Relation Age of Onset  . Stroke Mother   . Heart disease Father   The patient's father died at the age of 66 from a myocardial infarction.  The patient's mother died at the age of 85 from a stroke in the setting of diabetes.  The patient has one brother with a history of head and neck cancer.  GYN: She is  GX, P3, first pregnancy age 60.  She was on hormone replacement for about 22 years, just stopped last month.  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  She is divorced and lives by herself.  Her son Kathlene November works for [employer] and [employer]. His wife, Esau Grew, is a Education officer, community.  The patient's son Trey Paula works in finances and so does her daughter Bonita Quin.  They all live in  [place].  Kathlene November has twin children.  The patient is a Control and instrumentation engineer.  HEALTH MAINTENANCE: History  Substance Use Topics  . Smoking status: Never Smoker   . Smokeless tobacco: Never Used  . Alcohol Use: Yes     Comment: very seldom     Colonoscopy:  PAP:  Bone density:  Lipid panel:  No Known Allergies  Current Outpatient Prescriptions  Medication Sig Dispense Refill  . aspirin 325 MG tablet Take 325 mg by mouth daily.        [name] Kitchen atenolol (TENORMIN) 25 MG tablet Take 25 mg by mouth daily.        . calcium carbonate (TUMS - DOSED IN MG ELEMENTAL CALCIUM) 500 MG chewable tablet Chew 1 tablet by mouth daily.        . Cyanocobalamin (VITAMIN B 12 PO) Take 100 mcg by mouth daily.        . famotidine (PEPCID AC) 10 MG chewable tablet Chew 10 mg by mouth as needed.        . fenofibrate 160 MG tablet       . fish oil-omega-3 fatty acids 1000 MG capsule Take 2 g by mouth daily.        [name] Kitchen gabapentin (NEURONTIN) 100 MG capsule Take 100 mg by mouth 3 (three) times daily.        [name] Kitchen glucose blood (FREESTYLE LITE) test strip 1 each by Other route as needed. Use as instructed       . Lancets (FREESTYLE) lancets 1 each by Other route as needed. Use as instructed       . losartan-hydrochlorothiazide (HYZAAR) 50-12.5 MG per tablet Take 1 tablet by mouth daily.        . metFORMIN (GLUCOPHAGE) 500 MG tablet       . Multiple Vitamin (MULTIVITAMIN) tablet Take 1 tablet by mouth daily.        . niacin (NIASPAN) 750 MG CR tablet Take 750 mg by mouth at bedtime.        [name] Kitchen omeprazole (PRILOSEC) 40 MG capsule       . simvastatin (ZOCOR) 10 MG tablet       . tamoxifen (NOLVADEX) 20 MG tablet Take 1 tablet (20 mg total) by mouth daily.  90 tablet  4  . traMADol (ULTRAM) 50 MG tablet Take 50 mg by mouth 2 (two) times daily. Maximum dose= 8 tablets per day       . venlafaxine (EFFEXOR-XR) 75 MG 24 hr capsule         OBJECTIVE: Elderly white woman who appears well Filed Vitals:   05/12/12 1137  BP: 142/88  Pulse:  77  Temp: 98.1 F (36.7 C)  Resp: 20     Body mass index is 31.92 kg/(m^2).    ECOG FS: 1  Sclerae unicteric Oropharynx clear No peripheral adenopathy Lungs no rales or rhonchi Heart regular rate and rhythm Abd benign MSK no focal spinal tenderness, no peripheral edema Neuro: nonfocal Breasts: The right breast is unremarkable; left breast is  status post lumpectomy, no evidence of local recurrence. The left axilla is benign  LAB RESULTS: Lab Results  Component Value Date   WBC 6.7 05/05/2012   NEUTROABS 3.8 05/05/2012   HGB 14.3 05/05/2012   HCT 41.8 05/05/2012   MCV 94.3 05/05/2012   PLT 172 05/05/2012      Chemistry      Component Value Date/Time   NA 136 05/05/2012 0957   NA 139 08/27/2011 1025   NA 141 09/14/2010 1015   K 4.3 05/05/2012 0957   K 4.2 08/27/2011 1025   K 4.3 09/14/2010 1015   CL 100 05/05/2012 0957   CL 101 08/27/2011 1025   CL 98 09/14/2010 1015   CO2 27 05/05/2012 0957   CO2 26 08/27/2011 1025   CO2 28 09/14/2010 1015   BUN 28.0* 05/05/2012 0957   BUN 28* 08/27/2011 1025   BUN 27* 09/14/2010 1015   CREATININE 1.0 05/05/2012 0957   CREATININE 0.81 08/27/2011 1025   CREATININE 0.9 09/14/2010 1015      Component Value Date/Time   CALCIUM 9.6 05/05/2012 0957   CALCIUM 9.4 08/27/2011 1025   CALCIUM 9.1 09/14/2010 1015   ALKPHOS 46 05/05/2012 0957   ALKPHOS 54 08/27/2011 1025   ALKPHOS 35 09/14/2010 1015   AST 21 05/05/2012 0957   AST 16 08/27/2011 1025   AST 25 09/14/2010 1015   ALT 13 05/05/2012 0957   ALT 10 08/27/2011 1025   BILITOT 0.75 05/05/2012 0957   BILITOT 0.5 08/27/2011 1025   BILITOT 0.70 09/14/2010 1015       Lab Results  Component Value Date   LABCA2 15 08/27/2011    No components found with this basename: [id number]    No results found for this basename: INR:1;PROTIME:1 in the last 168 hours  Urinalysis    Component Value Date/Time   COLORURINE YELLOW 01/31/2007 0955   APPEARANCEUR CLOUDY* 01/31/2007 0955   LABSPEC 1.026 01/31/2007 0955   PHURINE 6.0 01/31/2007  0955   GLUCOSEU NEGATIVE 01/31/2007 0955   HGBUR NEGATIVE 01/31/2007 0955   BILIRUBINUR NEGATIVE 01/31/2007 0955   KETONESUR NEGATIVE 01/31/2007 0955   PROTEINUR NEGATIVE 01/31/2007 0955   UROBILINOGEN 1.0 01/31/2007 0955   NITRITE NEGATIVE 01/31/2007 0955   LEUKOCYTESUR MODERATE* 01/31/2007 0955    STUDIES:  DIGITAL DIAGNOSTIC BILATERAL MAMMOGRAM WITH CAD  Comparison: 01/11/2011, 01/09/2010, 01/07/2009  Findings: Scattered fibroglandular densities. Postsurgical scar  left upper outer quadrant stable. Scattered dystrophic bilateral  calcifications stable. No suspicious interval change.  Mammographic images were processed with CAD.  IMPRESSION:  Stable benign postoperative appearance  BI-RADS CATEGORY 2: Benign finding(s).  RECOMMENDATION:  Diagnostic bilateral mammogram in 1 year.    ASSESSMENT: 79 year old [place] woman status post left lumpectomy and sentinel lymph node biopsy October 2008 for a 2-mm invasive ductal carcinoma in the setting of ductal carcinoma in situ.  The invasive tumor was grade 1, node negative, strongly estrogen and progesterone receptor positive, HercepTest negative, with a low proliferation fraction.  After radiation completed January 2009, she started tamoxifen.   PLAN: She has completed 5 years of tamoxifen, and at this point uncomfortable releasing her back to her primary care physician. We reviewed the fact that her non-in base of breast cancer is not life-threatening, and that her a microinvasive breast cancer had a risk of recurrence of less than 5% at 10 years with local treatment only. The real point of the tamoxifen was preventive, and that has cut in half the risk of developing a new  breast cancer in either breast.  We have not made any further appointments for Arlee here but of course we will always be glad to see her again if the need were to arise. Colbie Danner C    05/12/2012

## 2012-12-08 ENCOUNTER — Other Ambulatory Visit (INDEPENDENT_AMBULATORY_CARE_PROVIDER_SITE_OTHER): Payer: Self-pay | Admitting: Surgery

## 2012-12-08 ENCOUNTER — Other Ambulatory Visit: Payer: Self-pay

## 2012-12-08 ENCOUNTER — Other Ambulatory Visit: Payer: Self-pay | Admitting: Family Medicine

## 2012-12-08 DIAGNOSIS — Z853 Personal history of malignant neoplasm of breast: Secondary | ICD-10-CM

## 2013-01-14 ENCOUNTER — Ambulatory Visit
Admission: RE | Admit: 2013-01-14 | Discharge: 2013-01-14 | Disposition: A | Discharge status: Home / Self Care | Payer: Medicare Other | Source: Ambulatory Visit | Attending: Family Medicine | Admitting: Family Medicine

## 2013-01-14 DIAGNOSIS — Z853 Personal history of malignant neoplasm of breast: Secondary | ICD-10-CM

## 2013-05-01 ENCOUNTER — Other Ambulatory Visit: Payer: Self-pay | Admitting: Family Medicine

## 2013-05-01 DIAGNOSIS — E2839 Other primary ovarian failure: Secondary | ICD-10-CM

## 2013-05-22 ENCOUNTER — Other Ambulatory Visit: Payer: Medicare Other

## 2013-05-25 ENCOUNTER — Ambulatory Visit
Admission: RE | Admit: 2013-05-25 | Discharge: 2013-05-25 | Disposition: A | Discharge status: Home / Self Care | Payer: Medicare Other | Source: Ambulatory Visit | Attending: Family Medicine | Admitting: Family Medicine

## 2013-05-25 DIAGNOSIS — E2839 Other primary ovarian failure: Secondary | ICD-10-CM

## 2013-12-24 ENCOUNTER — Other Ambulatory Visit: Payer: Self-pay | Admitting: Family Medicine

## 2013-12-24 DIAGNOSIS — Z853 Personal history of malignant neoplasm of breast: Secondary | ICD-10-CM

## 2014-01-22 ENCOUNTER — Ambulatory Visit
Admission: RE | Admit: 2014-01-22 | Discharge: 2014-01-22 | Disposition: A | Discharge status: Home / Self Care | Payer: Medicare Other | Source: Ambulatory Visit | Attending: Family Medicine | Admitting: Family Medicine

## 2014-01-22 ENCOUNTER — Encounter (INDEPENDENT_AMBULATORY_CARE_PROVIDER_SITE_OTHER): Payer: Self-pay

## 2014-01-22 DIAGNOSIS — Z853 Personal history of malignant neoplasm of breast: Secondary | ICD-10-CM

## 2014-04-30 HISTORY — PX: EYE SURGERY: SHX253

## 2015-03-03 ENCOUNTER — Other Ambulatory Visit: Payer: Self-pay

## 2015-03-03 DIAGNOSIS — Z1231 Encounter for screening mammogram for malignant neoplasm of breast: Secondary | ICD-10-CM

## 2015-03-17 ENCOUNTER — Ambulatory Visit
Admission: RE | Admit: 2015-03-17 | Discharge: 2015-03-17 | Disposition: A | Discharge status: Home / Self Care | Payer: Medicare Other | Source: Ambulatory Visit

## 2015-03-17 DIAGNOSIS — Z1231 Encounter for screening mammogram for malignant neoplasm of breast: Secondary | ICD-10-CM

## 2015-10-17 DIAGNOSIS — M5137 Other intervertebral disc degeneration, lumbosacral region: Secondary | ICD-10-CM | POA: Diagnosis not present

## 2015-10-17 DIAGNOSIS — M5136 Other intervertebral disc degeneration, lumbar region: Secondary | ICD-10-CM | POA: Diagnosis not present

## 2015-10-17 DIAGNOSIS — E0840 Diabetes mellitus due to underlying condition with diabetic neuropathy, unspecified: Secondary | ICD-10-CM | POA: Diagnosis not present

## 2015-10-17 DIAGNOSIS — M9903 Segmental and somatic dysfunction of lumbar region: Secondary | ICD-10-CM | POA: Diagnosis not present

## 2015-10-17 DIAGNOSIS — I10 Essential (primary) hypertension: Secondary | ICD-10-CM | POA: Diagnosis not present

## 2015-10-17 DIAGNOSIS — M5116 Intervertebral disc disorders with radiculopathy, lumbar region: Secondary | ICD-10-CM | POA: Diagnosis not present

## 2015-10-24 DIAGNOSIS — M5136 Other intervertebral disc degeneration, lumbar region: Secondary | ICD-10-CM | POA: Diagnosis not present

## 2015-10-24 DIAGNOSIS — I10 Essential (primary) hypertension: Secondary | ICD-10-CM | POA: Diagnosis not present

## 2015-10-24 DIAGNOSIS — E0840 Diabetes mellitus due to underlying condition with diabetic neuropathy, unspecified: Secondary | ICD-10-CM | POA: Diagnosis not present

## 2015-10-24 DIAGNOSIS — M5137 Other intervertebral disc degeneration, lumbosacral region: Secondary | ICD-10-CM | POA: Diagnosis not present

## 2015-10-24 DIAGNOSIS — M5116 Intervertebral disc disorders with radiculopathy, lumbar region: Secondary | ICD-10-CM | POA: Diagnosis not present

## 2015-10-24 DIAGNOSIS — M9903 Segmental and somatic dysfunction of lumbar region: Secondary | ICD-10-CM | POA: Diagnosis not present

## 2015-10-27 DIAGNOSIS — E119 Type 2 diabetes mellitus without complications: Secondary | ICD-10-CM | POA: Diagnosis not present

## 2015-10-31 DIAGNOSIS — M5116 Intervertebral disc disorders with radiculopathy, lumbar region: Secondary | ICD-10-CM | POA: Diagnosis not present

## 2015-10-31 DIAGNOSIS — E0840 Diabetes mellitus due to underlying condition with diabetic neuropathy, unspecified: Secondary | ICD-10-CM | POA: Diagnosis not present

## 2015-10-31 DIAGNOSIS — M5136 Other intervertebral disc degeneration, lumbar region: Secondary | ICD-10-CM | POA: Diagnosis not present

## 2015-10-31 DIAGNOSIS — M9903 Segmental and somatic dysfunction of lumbar region: Secondary | ICD-10-CM | POA: Diagnosis not present

## 2015-10-31 DIAGNOSIS — I10 Essential (primary) hypertension: Secondary | ICD-10-CM | POA: Diagnosis not present

## 2015-10-31 DIAGNOSIS — M5137 Other intervertebral disc degeneration, lumbosacral region: Secondary | ICD-10-CM | POA: Diagnosis not present

## 2015-11-08 DIAGNOSIS — M5136 Other intervertebral disc degeneration, lumbar region: Secondary | ICD-10-CM | POA: Diagnosis not present

## 2015-11-08 DIAGNOSIS — M5137 Other intervertebral disc degeneration, lumbosacral region: Secondary | ICD-10-CM | POA: Diagnosis not present

## 2015-11-08 DIAGNOSIS — E0840 Diabetes mellitus due to underlying condition with diabetic neuropathy, unspecified: Secondary | ICD-10-CM | POA: Diagnosis not present

## 2015-11-08 DIAGNOSIS — M5116 Intervertebral disc disorders with radiculopathy, lumbar region: Secondary | ICD-10-CM | POA: Diagnosis not present

## 2015-11-08 DIAGNOSIS — I10 Essential (primary) hypertension: Secondary | ICD-10-CM | POA: Diagnosis not present

## 2015-11-08 DIAGNOSIS — M9903 Segmental and somatic dysfunction of lumbar region: Secondary | ICD-10-CM | POA: Diagnosis not present

## 2015-11-15 DIAGNOSIS — M9903 Segmental and somatic dysfunction of lumbar region: Secondary | ICD-10-CM | POA: Diagnosis not present

## 2015-11-15 DIAGNOSIS — M5116 Intervertebral disc disorders with radiculopathy, lumbar region: Secondary | ICD-10-CM | POA: Diagnosis not present

## 2015-11-15 DIAGNOSIS — M5137 Other intervertebral disc degeneration, lumbosacral region: Secondary | ICD-10-CM | POA: Diagnosis not present

## 2015-11-15 DIAGNOSIS — E0840 Diabetes mellitus due to underlying condition with diabetic neuropathy, unspecified: Secondary | ICD-10-CM | POA: Diagnosis not present

## 2015-11-15 DIAGNOSIS — I10 Essential (primary) hypertension: Secondary | ICD-10-CM | POA: Diagnosis not present

## 2015-11-15 DIAGNOSIS — M5136 Other intervertebral disc degeneration, lumbar region: Secondary | ICD-10-CM | POA: Diagnosis not present

## 2015-11-29 DIAGNOSIS — E0840 Diabetes mellitus due to underlying condition with diabetic neuropathy, unspecified: Secondary | ICD-10-CM | POA: Diagnosis not present

## 2015-11-29 DIAGNOSIS — M5136 Other intervertebral disc degeneration, lumbar region: Secondary | ICD-10-CM | POA: Diagnosis not present

## 2015-11-29 DIAGNOSIS — M5137 Other intervertebral disc degeneration, lumbosacral region: Secondary | ICD-10-CM | POA: Diagnosis not present

## 2015-11-29 DIAGNOSIS — I10 Essential (primary) hypertension: Secondary | ICD-10-CM | POA: Diagnosis not present

## 2015-11-29 DIAGNOSIS — M5116 Intervertebral disc disorders with radiculopathy, lumbar region: Secondary | ICD-10-CM | POA: Diagnosis not present

## 2015-11-29 DIAGNOSIS — M9903 Segmental and somatic dysfunction of lumbar region: Secondary | ICD-10-CM | POA: Diagnosis not present

## 2015-12-01 DIAGNOSIS — I10 Essential (primary) hypertension: Secondary | ICD-10-CM | POA: Diagnosis not present

## 2015-12-01 DIAGNOSIS — Z853 Personal history of malignant neoplasm of breast: Secondary | ICD-10-CM | POA: Diagnosis not present

## 2015-12-01 DIAGNOSIS — E119 Type 2 diabetes mellitus without complications: Secondary | ICD-10-CM | POA: Diagnosis not present

## 2015-12-22 DIAGNOSIS — M5137 Other intervertebral disc degeneration, lumbosacral region: Secondary | ICD-10-CM | POA: Diagnosis not present

## 2015-12-22 DIAGNOSIS — E0840 Diabetes mellitus due to underlying condition with diabetic neuropathy, unspecified: Secondary | ICD-10-CM | POA: Diagnosis not present

## 2015-12-22 DIAGNOSIS — M5116 Intervertebral disc disorders with radiculopathy, lumbar region: Secondary | ICD-10-CM | POA: Diagnosis not present

## 2015-12-22 DIAGNOSIS — I10 Essential (primary) hypertension: Secondary | ICD-10-CM | POA: Diagnosis not present

## 2015-12-22 DIAGNOSIS — M5136 Other intervertebral disc degeneration, lumbar region: Secondary | ICD-10-CM | POA: Diagnosis not present

## 2015-12-22 DIAGNOSIS — M9903 Segmental and somatic dysfunction of lumbar region: Secondary | ICD-10-CM | POA: Diagnosis not present

## 2016-01-16 DIAGNOSIS — H04123 Dry eye syndrome of bilateral lacrimal glands: Secondary | ICD-10-CM | POA: Diagnosis not present

## 2016-02-17 ENCOUNTER — Other Ambulatory Visit: Payer: Self-pay | Admitting: Family Medicine

## 2016-02-17 DIAGNOSIS — Z1231 Encounter for screening mammogram for malignant neoplasm of breast: Secondary | ICD-10-CM

## 2016-03-16 DIAGNOSIS — E119 Type 2 diabetes mellitus without complications: Secondary | ICD-10-CM | POA: Diagnosis not present

## 2016-03-16 DIAGNOSIS — Z23 Encounter for immunization: Secondary | ICD-10-CM | POA: Diagnosis not present

## 2016-03-16 DIAGNOSIS — Z0000 Encounter for general adult medical examination without abnormal findings: Secondary | ICD-10-CM | POA: Diagnosis not present

## 2016-03-16 DIAGNOSIS — I10 Essential (primary) hypertension: Secondary | ICD-10-CM | POA: Diagnosis not present

## 2016-03-20 ENCOUNTER — Ambulatory Visit
Admission: RE | Admit: 2016-03-20 | Discharge: 2016-03-20 | Disposition: A | Discharge status: Home / Self Care | Payer: Medicare Other | Source: Ambulatory Visit | Attending: Family Medicine | Admitting: Family Medicine

## 2016-03-20 DIAGNOSIS — Z1231 Encounter for screening mammogram for malignant neoplasm of breast: Secondary | ICD-10-CM

## 2016-05-19 DIAGNOSIS — E119 Type 2 diabetes mellitus without complications: Secondary | ICD-10-CM | POA: Diagnosis not present

## 2016-06-18 DIAGNOSIS — G4733 Obstructive sleep apnea (adult) (pediatric): Secondary | ICD-10-CM | POA: Diagnosis not present

## 2016-06-18 DIAGNOSIS — E1169 Type 2 diabetes mellitus with other specified complication: Secondary | ICD-10-CM | POA: Diagnosis not present

## 2016-06-18 DIAGNOSIS — H35033 Hypertensive retinopathy, bilateral: Secondary | ICD-10-CM | POA: Diagnosis not present

## 2016-06-18 DIAGNOSIS — E782 Mixed hyperlipidemia: Secondary | ICD-10-CM | POA: Diagnosis not present

## 2016-06-18 DIAGNOSIS — I10 Essential (primary) hypertension: Secondary | ICD-10-CM | POA: Diagnosis not present

## 2016-06-18 DIAGNOSIS — E119 Type 2 diabetes mellitus without complications: Secondary | ICD-10-CM | POA: Diagnosis not present

## 2016-07-25 DIAGNOSIS — I10 Essential (primary) hypertension: Secondary | ICD-10-CM | POA: Diagnosis not present

## 2016-07-25 DIAGNOSIS — J209 Acute bronchitis, unspecified: Secondary | ICD-10-CM | POA: Diagnosis not present

## 2016-07-29 DIAGNOSIS — J019 Acute sinusitis, unspecified: Secondary | ICD-10-CM | POA: Diagnosis not present

## 2016-09-17 DIAGNOSIS — I10 Essential (primary) hypertension: Secondary | ICD-10-CM | POA: Diagnosis not present

## 2016-09-17 DIAGNOSIS — E119 Type 2 diabetes mellitus without complications: Secondary | ICD-10-CM | POA: Diagnosis not present

## 2016-09-17 DIAGNOSIS — E1169 Type 2 diabetes mellitus with other specified complication: Secondary | ICD-10-CM | POA: Diagnosis not present

## 2016-09-17 DIAGNOSIS — H35033 Hypertensive retinopathy, bilateral: Secondary | ICD-10-CM | POA: Diagnosis not present

## 2016-12-10 DIAGNOSIS — M47816 Spondylosis without myelopathy or radiculopathy, lumbar region: Secondary | ICD-10-CM | POA: Diagnosis not present

## 2016-12-10 DIAGNOSIS — M5136 Other intervertebral disc degeneration, lumbar region: Secondary | ICD-10-CM | POA: Diagnosis not present

## 2017-01-10 DIAGNOSIS — M545 Low back pain: Secondary | ICD-10-CM | POA: Diagnosis not present

## 2017-01-22 DIAGNOSIS — I10 Essential (primary) hypertension: Secondary | ICD-10-CM | POA: Diagnosis not present

## 2017-01-22 DIAGNOSIS — E119 Type 2 diabetes mellitus without complications: Secondary | ICD-10-CM | POA: Diagnosis not present

## 2017-01-22 DIAGNOSIS — Z23 Encounter for immunization: Secondary | ICD-10-CM | POA: Diagnosis not present

## 2017-01-23 DIAGNOSIS — M5136 Other intervertebral disc degeneration, lumbar region: Secondary | ICD-10-CM | POA: Diagnosis not present

## 2017-01-23 DIAGNOSIS — M4310 Spondylolisthesis, site unspecified: Secondary | ICD-10-CM | POA: Diagnosis not present

## 2017-01-23 DIAGNOSIS — M47816 Spondylosis without myelopathy or radiculopathy, lumbar region: Secondary | ICD-10-CM | POA: Diagnosis not present

## 2017-02-04 DIAGNOSIS — M47816 Spondylosis without myelopathy or radiculopathy, lumbar region: Secondary | ICD-10-CM | POA: Diagnosis not present

## 2017-02-11 DIAGNOSIS — M4310 Spondylolisthesis, site unspecified: Secondary | ICD-10-CM | POA: Diagnosis not present

## 2017-02-11 DIAGNOSIS — M47816 Spondylosis without myelopathy or radiculopathy, lumbar region: Secondary | ICD-10-CM | POA: Diagnosis not present

## 2017-02-11 DIAGNOSIS — M48062 Spinal stenosis, lumbar region with neurogenic claudication: Secondary | ICD-10-CM | POA: Diagnosis not present

## 2017-02-18 DIAGNOSIS — M4316 Spondylolisthesis, lumbar region: Secondary | ICD-10-CM | POA: Diagnosis not present

## 2017-02-18 DIAGNOSIS — M545 Low back pain: Secondary | ICD-10-CM | POA: Diagnosis not present

## 2017-02-18 DIAGNOSIS — M48062 Spinal stenosis, lumbar region with neurogenic claudication: Secondary | ICD-10-CM | POA: Diagnosis not present

## 2017-02-18 DIAGNOSIS — M5432 Sciatica, left side: Secondary | ICD-10-CM | POA: Diagnosis not present

## 2017-02-22 ENCOUNTER — Other Ambulatory Visit: Payer: Self-pay | Admitting: Geriatric Medicine

## 2017-02-22 DIAGNOSIS — Z1231 Encounter for screening mammogram for malignant neoplasm of breast: Secondary | ICD-10-CM

## 2017-03-18 DIAGNOSIS — L409 Psoriasis, unspecified: Secondary | ICD-10-CM | POA: Diagnosis not present

## 2017-03-25 ENCOUNTER — Ambulatory Visit
Admission: RE | Admit: 2017-03-25 | Discharge: 2017-03-25 | Disposition: A | Discharge status: Home / Self Care | Payer: Medicare Other | Source: Ambulatory Visit | Attending: Geriatric Medicine | Admitting: Geriatric Medicine

## 2017-03-25 DIAGNOSIS — Z1231 Encounter for screening mammogram for malignant neoplasm of breast: Secondary | ICD-10-CM

## 2017-03-29 DIAGNOSIS — N3941 Urge incontinence: Secondary | ICD-10-CM | POA: Diagnosis not present

## 2017-03-29 DIAGNOSIS — I10 Essential (primary) hypertension: Secondary | ICD-10-CM | POA: Diagnosis not present

## 2017-03-29 DIAGNOSIS — Z79899 Other long term (current) drug therapy: Secondary | ICD-10-CM | POA: Diagnosis not present

## 2017-03-29 DIAGNOSIS — E119 Type 2 diabetes mellitus without complications: Secondary | ICD-10-CM | POA: Diagnosis not present

## 2017-04-15 ENCOUNTER — Other Ambulatory Visit: Payer: Self-pay | Admitting: Dermatology

## 2017-04-15 DIAGNOSIS — C4491 Basal cell carcinoma of skin, unspecified: Secondary | ICD-10-CM

## 2017-04-15 DIAGNOSIS — C44319 Basal cell carcinoma of skin of other parts of face: Secondary | ICD-10-CM | POA: Diagnosis not present

## 2017-04-15 DIAGNOSIS — L409 Psoriasis, unspecified: Secondary | ICD-10-CM | POA: Diagnosis not present

## 2017-04-15 DIAGNOSIS — E119 Type 2 diabetes mellitus without complications: Secondary | ICD-10-CM | POA: Diagnosis not present

## 2017-04-15 HISTORY — DX: Basal cell carcinoma of skin, unspecified: C44.91

## 2017-05-16 ENCOUNTER — Other Ambulatory Visit: Payer: Self-pay | Admitting: Dermatology

## 2017-05-16 DIAGNOSIS — C44319 Basal cell carcinoma of skin of other parts of face: Secondary | ICD-10-CM | POA: Diagnosis not present

## 2017-05-28 NOTE — Progress Notes (Signed)
Please place orders in Epic as patient is being scheduled for a pre-op appointment! Thank you! 

## 2017-05-30 ENCOUNTER — Ambulatory Visit: Payer: Self-pay | Admitting: Orthopedic Surgery

## 2017-05-31 ENCOUNTER — Ambulatory Visit: Payer: Self-pay | Admitting: Orthopedic Surgery

## 2017-05-31 NOTE — H&P (View-Only) (Signed)
Paige KEEVEN is an 84 y.o. female.   Chief Complaint: back and left leg pain HPI: The patient is a 84 year old female who presents with back pain. The patient is here today for a surgical consult and in referral from Dr. Nelva Bush. The patient reports low back symptoms which began year(s) ago without any known injury. Symptoms are reported to be located in the left low back and Symptoms include muscle spasm, burning and tingling. The pain radiates to the left buttock and left lateral lower leg. The patient describes the pain as burning, aching and tingling. The patient feels as if the symptoms are worsening. Symptoms are exacerbated by standing (and walking). Pertinent medical history includes sciatica. Past evaluation has included x-ray of the lumbar spine and MRI of the lumbar spine. Past treatment has included non-opioid analgesics, opioid analgesics, muscle relaxants, epidural injections and physical therapy.  Note:Patient presents here with a caretaker. She has had disabling leg pain radiates down into the top and outside of her foot or years now worse as of late. I am she basically has it whenever she stands. It is not present when she sits but significantly reduced. She can sleep. She does not know her bone density.  Dr. Nelva Bush is repeat an MRI indicating moderate to severe lateral recess stenosis at L4-5 and L3-4. Moderate right foraminal narrowing. Contact of the S1 nerve roots with minimal displacement on the right at L5-S1.  Past Medical History:  Diagnosis Date  . Cancer (HCC)    breast  . Diabetes mellitus   . GERD (gastroesophageal reflux disease)   . History of breast cancer   . hx: breast cancer, left UOQ, invasive ductal carcinoma, receptor + her 2 - 01/06/2007  . Hyperlipidemia   . Hypertension   . Neuromuscular disorder (HCC)    sciatica  . Restless leg     Past Surgical History:  Procedure Laterality Date  . APPENDECTOMY  1986   TAH   . BREAST EXCISIONAL BIOPSY Left  2008   benign  . BREAST LUMPECTOMY W/ NEEDLE LOCALIZATION  02/04/2007   left breast cancer  . TUBAL LIGATION  1970s    Family History  Problem Relation Age of Onset  . Stroke Mother   . Heart disease Father    Social History:  reports that  has never smoked. she has never used smokeless tobacco. She reports that she drinks alcohol. She reports that she does not use drugs.  Allergies: No Known Allergies   (Not in a hospital admission)  No results found for this or any previous visit (from the past 48 hour(s)). No results found.  Review of Systems  Constitutional: Negative.   HENT: Negative.   Eyes: Negative.   Respiratory: Negative.   Cardiovascular: Negative.   Gastrointestinal: Negative.   Genitourinary: Negative.   Musculoskeletal: Positive for back pain.  Skin: Negative.   Neurological: Positive for sensory change and focal weakness.  Psychiatric/Behavioral: Negative.     There were no vitals taken for this visit. Physical Exam  Constitutional: She is oriented to person, place, and time. She appears well-developed.  HENT:  Head: Normocephalic.  Eyes: Pupils are equal, round, and reactive to light.  Neck: Normal range of motion.  Cardiovascular: Normal rate.  Respiratory: Effort normal.  GI: Soft.  Musculoskeletal:  Healthy. My distress. Straight leg raise buttock flap and left negative on the right EHLs 4+/5 left compared to the right. Sensory exam is intact. Otherwise motor is 5 5 in the  quadriceps, hamstrings, tibialis anterior, hip abductors. Tender over the trochanters. No instability or pain with range of motion of the hips knees and ankles. No Babinski or clonus. Pain with extension of the lumbar spine. Abdomen soft pelvis is stable nontender thoracic spine  Cervical spine some discomfort in forward flexion. Motor is 5/5 in the upper extremities. She is normoreflexic. Sensory exam is intact. No Hoffmann sign. She walks with a forward flexed gait.   Neurological: She is alert and oriented to person, place, and time.  Skin: Skin is warm and dry.    X-rays 3 view AP lateral flexion-extension demonstrates a grade 1 listhesis at L4-5 end-stage without instability in flexion extension. There is a mild listhesis at the L3-4 without instability in flexion extension.  MRI demonstrates severe lateral recess stenosis and moderate central stenosis at L4-5 due to facet hypertrophy and a listhesis. There is lateral recess stenosis at 3-4. Mild at L5-S1.  Assessment/Plan Patient demonstrates neurogenic claudication secondary to spinal stenosis. She has positional radiculopathy on the left. I do feel it is mainly secondary to her severe lateral recess stenosis at 4 5 I feel that the L5 and S1 nerve roots are being affected into the lateral recess at 4-5. L5-S1 does not show any significant lateral recess stenosis or effect of the S1 nerve root. She does have lateral recess stenosis is moderate at L3-4. Basically MRIs tend to underestimate the degree of stenosis at is it is a an evaluation in the supine position unloaded the disc is not compressed the ligamentum flavum is not buckled and therefore will tend to underestimate that. Given that the positional pathology is is typically worse at those levels. She would require surgically since she has failed conservative treatment. I discussed at length posture modifications and work around some terms of the stenosis. Utilizing a cane and a rolling walker or sitting. Exercising on a stationary bike. She indicates she has tried all that she does not want to live with her current symptoms. She is significantly disabled by them and the epidurals have given her no help. I we discussed surgical options it would require a decompression L4-5 and a lateral mass fusion. She is not moving and L4-5 but does have an offset. She does have underlying ostia opinion probable osteoporosis. Instrumentation is not indicated.  Initially it thought she might be a candidate for a Coflex but I do feel removal of the neural arch is a requirement. That will also allow [place] to decompress L3-4 as that does appear to be pathologic as well. Will use autograft as well as allograft and I discussed that with her. Also discussed wrist minutes including bleeding infection no changes in symptoms worsen and symptoms need for nerve root to heal. CSF leakage DVT PE anesthetic complications etc. She will require preoperative clearance.  Plan microlumbar decompression L3-4, L4-5, lateral mass fusion L4-5 with allograft and autograft bone  Cecilie Kicks., PA-C for Dr. Tonita Cong 05/31/2017, 12:28 PM

## 2017-05-31 NOTE — H&P (Signed)
Paige Johnston is an 84 y.o. female.   Chief Complaint: back and left leg pain HPI: The patient is a 84 year old female who presents with back pain. The patient is here today for a surgical consult and in referral from Dr. Nelva Bush. The patient reports low back symptoms which began year(s) ago without any known injury. Symptoms are reported to be located in the left low back and Symptoms include muscle spasm, burning and tingling. The pain radiates to the left buttock and left lateral lower leg. The patient describes the pain as burning, aching and tingling. The patient feels as if the symptoms are worsening. Symptoms are exacerbated by standing (and walking). Pertinent medical history includes sciatica. Past evaluation has included x-ray of the lumbar spine and MRI of the lumbar spine. Past treatment has included non-opioid analgesics, opioid analgesics, muscle relaxants, epidural injections and physical therapy.  Note:Patient presents here with a caretaker. She has had disabling leg pain radiates down into the top and outside of her foot or years now worse as of late. I am she basically has it whenever she stands. It is not present when she sits but significantly reduced. She can sleep. She does not know her bone density.  Dr. Nelva Bush is repeat an MRI indicating moderate to severe lateral recess stenosis at L4-5 and L3-4. Moderate right foraminal narrowing. Contact of the S1 nerve roots with minimal displacement on the right at L5-S1.  Past Medical History:  Diagnosis Date  . Cancer (HCC)    breast  . Diabetes mellitus   . GERD (gastroesophageal reflux disease)   . History of breast cancer   . hx: breast cancer, left UOQ, invasive ductal carcinoma, receptor + her 2 - 01/06/2007  . Hyperlipidemia   . Hypertension   . Neuromuscular disorder (HCC)    sciatica  . Restless leg     Past Surgical History:  Procedure Laterality Date  . APPENDECTOMY  1986   TAH   . BREAST EXCISIONAL BIOPSY Left  2008   benign  . BREAST LUMPECTOMY W/ NEEDLE LOCALIZATION  02/04/2007   left breast cancer  . TUBAL LIGATION  1970s    Family History  Problem Relation Age of Onset  . Stroke Mother   . Heart disease Father    Social History:  reports that  has never smoked. she has never used smokeless tobacco. She reports that she drinks alcohol. She reports that she does not use drugs.  Allergies: No Known Allergies   (Not in a hospital admission)  No results found for this or any previous visit (from the past 48 hour(s)). No results found.  Review of Systems  Constitutional: Negative.   HENT: Negative.   Eyes: Negative.   Respiratory: Negative.   Cardiovascular: Negative.   Gastrointestinal: Negative.   Genitourinary: Negative.   Musculoskeletal: Positive for back pain.  Skin: Negative.   Neurological: Positive for sensory change and focal weakness.  Psychiatric/Behavioral: Negative.     There were no vitals taken for this visit. Physical Exam  Constitutional: She is oriented to person, place, and time. She appears well-developed.  HENT:  Head: Normocephalic.  Eyes: Pupils are equal, round, and reactive to light.  Neck: Normal range of motion.  Cardiovascular: Normal rate.  Respiratory: Effort normal.  GI: Soft.  Musculoskeletal:  Healthy. My distress. Straight leg raise buttock flap and left negative on the right EHLs 4+/5 left compared to the right. Sensory exam is intact. Otherwise motor is 5 5 in the  quadriceps, hamstrings, tibialis anterior, hip abductors. Tender over the trochanters. No instability or pain with range of motion of the hips knees and ankles. No Babinski or clonus. Pain with extension of the lumbar spine. Abdomen soft pelvis is stable nontender thoracic spine  Cervical spine some discomfort in forward flexion. Motor is 5/5 in the upper extremities. She is normoreflexic. Sensory exam is intact. No Hoffmann sign. She walks with a forward flexed gait.   Neurological: She is alert and oriented to person, place, and time.  Skin: Skin is warm and dry.    X-rays 3 view AP lateral flexion-extension demonstrates a grade 1 listhesis at L4-5 end-stage without instability in flexion extension. There is a mild listhesis at the L3-4 without instability in flexion extension.  MRI demonstrates severe lateral recess stenosis and moderate central stenosis at L4-5 due to facet hypertrophy and a listhesis. There is lateral recess stenosis at 3-4. Mild at L5-S1.  Assessment/Plan Patient demonstrates neurogenic claudication secondary to spinal stenosis. She has positional radiculopathy on the left. I do feel it is mainly secondary to her severe lateral recess stenosis at 4 5 I feel that the L5 and S1 nerve roots are being affected into the lateral recess at 4-5. L5-S1 does not show any significant lateral recess stenosis or effect of the S1 nerve root. She does have lateral recess stenosis is moderate at L3-4. Basically MRIs tend to underestimate the degree of stenosis at is it is a an evaluation in the supine position unloaded the disc is not compressed the ligamentum flavum is not buckled and therefore will tend to underestimate that. Given that the positional pathology is is typically worse at those levels. She would require surgically since she has failed conservative treatment. I discussed at length posture modifications and work around some terms of the stenosis. Utilizing a cane and a rolling walker or sitting. Exercising on a stationary bike. She indicates she has tried all that she does not want to live with her current symptoms. She is significantly disabled by them and the epidurals have given her no help. I we discussed surgical options it would require a decompression L4-5 and a lateral mass fusion. She is not moving and L4-5 but does have an offset. She does have underlying ostia opinion probable osteoporosis. Instrumentation is not indicated.  Initially it thought she might be a candidate for a Coflex but I do feel removal of the neural arch is a requirement. That will also allow [place] to decompress L3-4 as that does appear to be pathologic as well. Will use autograft as well as allograft and I discussed that with her. Also discussed wrist minutes including bleeding infection no changes in symptoms worsen and symptoms need for nerve root to heal. CSF leakage DVT PE anesthetic complications etc. She will require preoperative clearance.  Plan microlumbar decompression L3-4, L4-5, lateral mass fusion L4-5 with allograft and autograft bone  Cecilie Kicks., PA-C for Dr. Tonita Cong 05/31/2017, 12:28 PM

## 2017-06-14 ENCOUNTER — Other Ambulatory Visit (HOSPITAL_COMMUNITY): Payer: Self-pay | Admitting: Emergency Medicine

## 2017-06-14 NOTE — Patient Instructions (Signed)
Paige Johnston  06/14/2017   Your procedure is scheduled on: 06-20-17    Report to [facility] Main  Entrance    Report to admitting at 8:00AM   Call this number if you have problems the morning of surgery [phone]     Remember: Do not eat food or drink liquids :After Midnight.     Take these medicines the morning of surgery with A SIP OF WATER: NONE                                You may not have any metal on your body including hair pins and              piercings  Do not wear jewelry, make-up, lotions, powders or perfumes, deodorant             Do not wear nail polish.  Do not shave  48 hours prior to surgery.     Do not bring valuables to the hospital. [place].  Contacts, dentures or bridgework may not be worn into surgery.  Leave suitcase in the car. After surgery it may be brought to your room.                 Please read over the following fact sheets you were given: _____________________________________________________________________             How to Manage Your Diabetes Before and After Surgery  Why is it important to control my blood sugar before and after surgery? . Improving blood sugar levels before and after surgery helps healing and can limit problems. . A way of improving blood sugar control is eating a healthy diet by: o  Eating less sugar and carbohydrates o  Increasing activity/exercise o  Talking with your doctor about reaching your blood sugar goals . High blood sugars (greater than 180 mg/dL) can raise your risk of infections and slow your recovery, so you will need to focus on controlling your diabetes during the weeks before surgery. . Make sure that the doctor who takes care of your diabetes knows about your planned surgery including the date and location.  How do I manage my blood sugar before surgery? . Check your blood sugar at least 4 times a day, starting 2  days before surgery, to make sure that the level is not too high or low. o Check your blood sugar the morning of your surgery when you wake up and every 2 hours until you get to the Short Stay unit. . If your blood sugar is less than 70 mg/dL, you will need to treat for low blood sugar: o Do not take insulin. o Treat a low blood sugar (less than 70 mg/dL) with  cup of clear juice (cranberry or apple), 4 glucose tablets, OR glucose gel. o Recheck blood sugar in 15 minutes after treatment (to make sure it is greater than 70 mg/dL). If your blood sugar is not greater than 70 mg/dL on recheck, call [phone] for further instructions. . Report your blood sugar to the short stay nurse when you get to Short Stay.  . If you are admitted to the hospital after surgery: o Your blood sugar will be checked by the staff  and you will probably be given insulin after surgery (instead of oral diabetes medicines) to make sure you have good blood sugar levels. o The goal for blood sugar control after surgery is 80-180 mg/dL.   WHAT DO I DO ABOUT MY DIABETES MEDICATION?   . THE DAY BEFORE SURGERY, take METFORMIN normally      . THE MORNING OF SURGERY, DO NOT TAKE ANY DIABETIC MEDICATIONS !   Patient Signature:  Date:   Nurse Signature:  Date:   Reviewed and Endorsed by [facility] Patient Education Committee, August 2015    [facility] - Preparing for Surgery Before surgery, you can play an important role.  Because skin is not sterile, your skin needs to be as free of germs as possible.  You can reduce the number of germs on your skin by washing with CHG (chlorahexidine gluconate) soap before surgery.  CHG is an antiseptic cleaner which kills germs and bonds with the skin to continue killing germs even after washing. Please DO NOT use if you have an allergy to CHG or antibacterial soaps.  If your skin becomes reddened/irritated stop using the CHG and inform your nurse when you arrive at Short  Stay. Do not shave (including legs and underarms) for at least 48 hours prior to the first CHG shower.  You may shave your face/neck. Please follow these instructions carefully:  1.  Shower with CHG Soap the night before surgery and the  morning of Surgery.  2.  If you choose to wash your hair, wash your hair first as usual with your  normal  shampoo.  3.  After you shampoo, rinse your hair and body thoroughly to remove the  shampoo.                           4.  Use CHG as you would any other liquid soap.  You can apply chg directly  to the skin and wash                       Gently with a scrungie or clean washcloth.  5.  Apply the CHG Soap to your body ONLY FROM THE NECK DOWN.   Do not use on face/ open                           Wound or open sores. Avoid contact with eyes, ears mouth and genitals (private parts).                       Wash face,  Genitals (private parts) with your normal soap.             6.  Wash thoroughly, paying special attention to the area where your surgery  will be performed.  7.  Thoroughly rinse your body with warm water from the neck down.  8.  DO NOT shower/wash with your normal soap after using and rinsing off  the CHG Soap.                9.  Pat yourself dry with a clean towel.            10.  Wear clean pajamas.            11.  Place clean sheets on your bed the night of your first shower and do not  sleep with pets. Day  of Surgery : Do not apply any lotions/deodorants the morning of surgery.  Please wear clean clothes to the hospital/surgery center.  FAILURE TO FOLLOW THESE INSTRUCTIONS MAY RESULT IN THE CANCELLATION OF YOUR SURGERY PATIENT SIGNATURE_________________________________  NURSE SIGNATURE__________________________________  ________________________________________________________________________   Adam Phenix  An incentive spirometer is a tool that can help keep your lungs clear and active. This tool measures how well you are  filling your lungs with each breath. Taking long deep breaths may help reverse or decrease the chance of developing breathing (pulmonary) problems (especially infection) following:  A long period of time when you are unable to move or be active. BEFORE THE PROCEDURE   If the spirometer includes an indicator to show your best effort, your nurse or respiratory therapist will set it to a desired goal.  If possible, sit up straight or lean slightly forward. Try not to slouch.  Hold the incentive spirometer in an upright position. INSTRUCTIONS FOR USE  1. Sit on the edge of your bed if possible, or sit up as far as you can in bed or on a chair. 2. Hold the incentive spirometer in an upright position. 3. Breathe out normally. 4. Place the mouthpiece in your mouth and seal your lips tightly around it. 5. Breathe in slowly and as deeply as possible, raising the piston or the ball toward the top of the column. 6. Hold your breath for 3-5 seconds or for as long as possible. Allow the piston or ball to fall to the bottom of the column. 7. Remove the mouthpiece from your mouth and breathe out normally. 8. Rest for a few seconds and repeat Steps 1 through 7 at least 10 times every 1-2 hours when you are awake. Take your time and take a few normal breaths between deep breaths. 9. The spirometer may include an indicator to show your best effort. Use the indicator as a goal to work toward during each repetition. 10. After each set of 10 deep breaths, practice coughing to be sure your lungs are clear. If you have an incision (the cut made at the time of surgery), support your incision when coughing by placing a pillow or rolled up towels firmly against it. Once you are able to get out of bed, walk around indoors and cough well. You may stop using the incentive spirometer when instructed by your caregiver.  RISKS AND COMPLICATIONS  Take your time so you do not get dizzy or light-headed.  If you are in pain,  you may need to take or ask for pain medication before doing incentive spirometry. It is harder to take a deep breath if you are having pain. AFTER USE  Rest and breathe slowly and easily.  It can be helpful to keep track of a log of your progress. Your caregiver can provide you with a simple table to help with this. If you are using the spirometer at home, follow these instructions: [place] IF:   You are having difficultly using the spirometer.  You have trouble using the spirometer as often as instructed.  Your pain medication is not giving enough relief while using the spirometer.  You develop fever of 100.5 F (38.1 C) or higher. SEEK IMMEDIATE MEDICAL CARE IF:   You cough up bloody sputum that had not been present before.  You develop fever of 102 F (38.9 C) or greater.  You develop worsening pain at or near the incision site. MAKE SURE YOU:   Understand these instructions.  Will watch your condition.  Will get help right away if you are not doing well or get worse. Document Released: 08/27/2006 Document Revised: 07/09/2011 Document Reviewed: 10/28/2006 [facility] Patient Information 2014 [place], [place].   ________________________________________________________________________

## 2017-06-14 NOTE — Progress Notes (Signed)
LOV  Dr Lajean Manes 03-29-17 on chart   Hgba1c, cmp, cbcdiff 03-29-17 on chart , [facility] Gaynelle Arabian

## 2017-06-17 ENCOUNTER — Encounter (HOSPITAL_COMMUNITY)
Admission: RE | Admit: 2017-06-17 | Discharge: 2017-06-17 | Disposition: A | Discharge status: Home / Self Care | Payer: Medicare Other | Source: Ambulatory Visit | Attending: Specialist | Admitting: Specialist

## 2017-06-17 ENCOUNTER — Encounter (HOSPITAL_COMMUNITY): Payer: Self-pay

## 2017-06-17 ENCOUNTER — Ambulatory Visit (HOSPITAL_COMMUNITY)
Admission: RE | Admit: 2017-06-17 | Discharge: 2017-06-17 | Disposition: A | Discharge status: Home / Self Care | Payer: Medicare Other | Source: Ambulatory Visit | Attending: Orthopedic Surgery | Admitting: Orthopedic Surgery

## 2017-06-17 ENCOUNTER — Other Ambulatory Visit: Payer: Self-pay

## 2017-06-17 DIAGNOSIS — M5126 Other intervertebral disc displacement, lumbar region: Secondary | ICD-10-CM | POA: Diagnosis not present

## 2017-06-17 DIAGNOSIS — M48061 Spinal stenosis, lumbar region without neurogenic claudication: Secondary | ICD-10-CM | POA: Insufficient documentation

## 2017-06-17 DIAGNOSIS — E119 Type 2 diabetes mellitus without complications: Secondary | ICD-10-CM | POA: Diagnosis not present

## 2017-06-17 DIAGNOSIS — I700 Atherosclerosis of aorta: Secondary | ICD-10-CM | POA: Diagnosis not present

## 2017-06-17 DIAGNOSIS — M5136 Other intervertebral disc degeneration, lumbar region: Secondary | ICD-10-CM | POA: Insufficient documentation

## 2017-06-17 DIAGNOSIS — I10 Essential (primary) hypertension: Secondary | ICD-10-CM | POA: Diagnosis not present

## 2017-06-17 DIAGNOSIS — K769 Liver disease, unspecified: Secondary | ICD-10-CM | POA: Diagnosis not present

## 2017-06-17 DIAGNOSIS — Z01810 Encounter for preprocedural cardiovascular examination: Secondary | ICD-10-CM | POA: Diagnosis not present

## 2017-06-17 DIAGNOSIS — Z01812 Encounter for preprocedural laboratory examination: Secondary | ICD-10-CM | POA: Insufficient documentation

## 2017-06-17 DIAGNOSIS — M4316 Spondylolisthesis, lumbar region: Secondary | ICD-10-CM | POA: Insufficient documentation

## 2017-06-17 DIAGNOSIS — R9431 Abnormal electrocardiogram [ECG] [EKG]: Secondary | ICD-10-CM | POA: Diagnosis not present

## 2017-06-17 HISTORY — DX: Sleep apnea, unspecified: G47.30

## 2017-06-17 HISTORY — DX: Psoriasis, unspecified: L40.9

## 2017-06-17 LAB — BASIC METABOLIC PANEL
ANION GAP: 11 (ref 5–15)
BUN: 16 mg/dL (ref 6–20)
CHLORIDE: 101 mmol/L (ref 101–111)
CO2: 26 mmol/L (ref 22–32)
Calcium: 9.8 mg/dL (ref 8.9–10.3)
Creatinine, Ser: 0.72 mg/dL (ref 0.44–1.00)
Glucose, Bld: 128 mg/dL — ABNORMAL HIGH (ref 65–99)
POTASSIUM: 4.6 mmol/L (ref 3.5–5.1)
SODIUM: 138 mmol/L (ref 135–145)

## 2017-06-17 LAB — CBC
HEMATOCRIT: 40.7 % (ref 36.0–46.0)
HEMOGLOBIN: 13.9 g/dL (ref 12.0–15.0)
MCH: 31 pg (ref 26.0–34.0)
MCHC: 34.2 g/dL (ref 30.0–36.0)
MCV: 90.8 fL (ref 78.0–100.0)
Platelets: 177 10*3/uL (ref 150–400)
RBC: 4.48 MIL/uL (ref 3.87–5.11)
RDW: 13 % (ref 11.5–15.5)
WBC: 7 10*3/uL (ref 4.0–10.5)

## 2017-06-17 LAB — SURGICAL PCR SCREEN
MRSA, PCR: INVALID — AB
STAPHYLOCOCCUS AUREUS: INVALID — AB

## 2017-06-17 LAB — GLUCOSE, CAPILLARY: Glucose-Capillary: 120 mg/dL — ABNORMAL HIGH (ref 65–99)

## 2017-06-17 LAB — ABO/RH: ABO/RH(D): O POS

## 2017-06-17 LAB — HEMOGLOBIN A1C
Hgb A1c MFr Bld: 7.5 % — ABNORMAL HIGH (ref 4.8–5.6)
MEAN PLASMA GLUCOSE: 168.55 mg/dL

## 2017-06-17 NOTE — Progress Notes (Signed)
RN called and LVMM for [name] at [facility] as Orson Slick is out sick per voicemail recording. RN requesting clearances be faxed to PAT

## 2017-06-19 LAB — MRSA CULTURE: CULTURE: NOT DETECTED

## 2017-06-19 NOTE — Progress Notes (Signed)
RN received call form Velvet McBride telling RN that she had sent patients clearance form. RN checked fax but no clearance seen. RN will call back and make Velvet aware that no clearance received

## 2017-06-19 NOTE — Progress Notes (Signed)
SURGICAL CLEARANCE ON CHART , DR. HAL STONEKING 03-29-17

## 2017-06-19 NOTE — Progress Notes (Signed)
RN has received no clearance form for patient from Dr Bernadette Hoit office since original request on 06-17-17. RN called Orson Slick today who is still out of the office. RN then called Fabio Asa and again had to [facility] requesting surgical clearance as the patient surgery in tomorrow.. RN again mentions in VMM that it is Lacie Draft, Dr Reather Littler PA, who mentioned in her H&P on 05-31-17 , that the patient would need to have pre-op clearance. RN will continue to F/U .

## 2017-06-20 ENCOUNTER — Ambulatory Visit (HOSPITAL_COMMUNITY)
Admission: RE | Admit: 2017-06-20 | Discharge: 2017-06-21 | Disposition: A | Discharge status: Home / Self Care | Payer: Medicare Other | Source: Ambulatory Visit | Attending: Specialist | Admitting: Specialist

## 2017-06-20 ENCOUNTER — Encounter (HOSPITAL_COMMUNITY): Payer: Self-pay

## 2017-06-20 ENCOUNTER — Ambulatory Visit (HOSPITAL_COMMUNITY): Payer: Medicare Other | Admitting: Anesthesiology

## 2017-06-20 ENCOUNTER — Ambulatory Visit (HOSPITAL_COMMUNITY): Payer: Medicare Other

## 2017-06-20 ENCOUNTER — Other Ambulatory Visit: Payer: Self-pay

## 2017-06-20 ENCOUNTER — Encounter (HOSPITAL_COMMUNITY)
Admission: RE | Disposition: A | Discharge status: Home / Self Care | Payer: Self-pay | Source: Ambulatory Visit | Attending: Specialist

## 2017-06-20 DIAGNOSIS — M48062 Spinal stenosis, lumbar region with neurogenic claudication: Secondary | ICD-10-CM | POA: Insufficient documentation

## 2017-06-20 DIAGNOSIS — G4730 Sleep apnea, unspecified: Secondary | ICD-10-CM | POA: Diagnosis not present

## 2017-06-20 DIAGNOSIS — I10 Essential (primary) hypertension: Secondary | ICD-10-CM | POA: Diagnosis not present

## 2017-06-20 DIAGNOSIS — M4316 Spondylolisthesis, lumbar region: Secondary | ICD-10-CM | POA: Insufficient documentation

## 2017-06-20 DIAGNOSIS — Z981 Arthrodesis status: Secondary | ICD-10-CM | POA: Diagnosis not present

## 2017-06-20 DIAGNOSIS — M48061 Spinal stenosis, lumbar region without neurogenic claudication: Secondary | ICD-10-CM | POA: Diagnosis present

## 2017-06-20 DIAGNOSIS — Z419 Encounter for procedure for purposes other than remedying health state, unspecified: Secondary | ICD-10-CM

## 2017-06-20 DIAGNOSIS — M47896 Other spondylosis, lumbar region: Secondary | ICD-10-CM | POA: Diagnosis not present

## 2017-06-20 DIAGNOSIS — E119 Type 2 diabetes mellitus without complications: Secondary | ICD-10-CM | POA: Diagnosis not present

## 2017-06-20 DIAGNOSIS — M5416 Radiculopathy, lumbar region: Secondary | ICD-10-CM | POA: Insufficient documentation

## 2017-06-20 HISTORY — PX: LUMBAR LAMINECTOMY/DECOMPRESSION MICRODISCECTOMY: SHX5026

## 2017-06-20 LAB — TYPE AND SCREEN
ABO/RH(D): O POS
ANTIBODY SCREEN: NEGATIVE

## 2017-06-20 LAB — GLUCOSE, CAPILLARY
Glucose-Capillary: 136 mg/dL — ABNORMAL HIGH (ref 65–99)
Glucose-Capillary: 214 mg/dL — ABNORMAL HIGH (ref 65–99)
Glucose-Capillary: 293 mg/dL — ABNORMAL HIGH (ref 65–99)

## 2017-06-20 SURGERY — LUMBAR LAMINECTOMY/DECOMPRESSION MICRODISCECTOMY 2 LEVELS
Anesthesia: General

## 2017-06-20 MED ORDER — KCL IN DEXTROSE-NACL 20-5-0.45 MEQ/L-%-% IV SOLN
INTRAVENOUS | Status: DC
  Filled 2017-06-20: qty 1000

## 2017-06-20 MED ORDER — SUGAMMADEX SODIUM 200 MG/2ML IV SOLN
INTRAVENOUS | Status: DC | PRN
  Administered 2017-06-20: 200 mg via INTRAVENOUS

## 2017-06-20 MED ORDER — MENTHOL 3 MG MT LOZG
1.0000 | LOZENGE | OROMUCOSAL | Status: DC | PRN
  Administered 2017-06-21: 3 mg via ORAL
  Filled 2017-06-20: qty 9

## 2017-06-20 MED ORDER — ONDANSETRON HCL 4 MG/2ML IJ SOLN: 4.0000 mg | Freq: Four times a day (QID) | INTRAMUSCULAR | Status: DC | PRN

## 2017-06-20 MED ORDER — BUPIVACAINE-EPINEPHRINE (PF) 0.5% -1:200000 IJ SOLN
INTRAMUSCULAR | Status: DC | PRN
  Administered 2017-06-20: 13 mL

## 2017-06-20 MED ORDER — ACETAMINOPHEN 650 MG RE SUPP: 650.0000 mg | RECTAL | Status: DC | PRN

## 2017-06-20 MED ORDER — POLYETHYLENE GLYCOL 3350 17 G PO PACK: 17.0000 g | PACK | Freq: Every day | ORAL | Status: DC | PRN

## 2017-06-20 MED ORDER — HYDROMORPHONE HCL 1 MG/ML IJ SOLN: 0.2500 mg | INTRAMUSCULAR | Status: DC | PRN

## 2017-06-20 MED ORDER — FENTANYL CITRATE (PF) 250 MCG/5ML IJ SOLN
INTRAMUSCULAR | Status: AC
  Filled 2017-06-20: qty 5

## 2017-06-20 MED ORDER — METOPROLOL SUCCINATE ER 50 MG PO TB24
100.0000 mg | ORAL_TABLET | Freq: Every day | ORAL | Status: DC
  Administered 2017-06-20 – 2017-06-21 (×2): 100 mg via ORAL
  Filled 2017-06-20 (×2): qty 2

## 2017-06-20 MED ORDER — FENTANYL CITRATE (PF) 100 MCG/2ML IJ SOLN
INTRAMUSCULAR | Status: DC | PRN
  Administered 2017-06-20: 100 ug via INTRAVENOUS

## 2017-06-20 MED ORDER — BISACODYL 5 MG PO TBEC: 5.0000 mg | DELAYED_RELEASE_TABLET | Freq: Every day | ORAL | Status: DC | PRN

## 2017-06-20 MED ORDER — SODIUM CHLORIDE 0.9 % IV SOLN
INTRAVENOUS | Status: DC | PRN
  Administered 2017-06-20: 500 mL

## 2017-06-20 MED ORDER — ONDANSETRON HCL 4 MG PO TABS: 4.0000 mg | ORAL_TABLET | Freq: Four times a day (QID) | ORAL | Status: DC | PRN

## 2017-06-20 MED ORDER — OXYCODONE HCL 5 MG PO TABS
5.0000 mg | ORAL_TABLET | ORAL | Status: DC | PRN
  Administered 2017-06-20 – 2017-06-21 (×5): 5 mg via ORAL
  Filled 2017-06-20 (×5): qty 1

## 2017-06-20 MED ORDER — INSULIN ASPART 100 UNIT/ML ~~LOC~~ SOLN
0.0000 [IU] | Freq: Three times a day (TID) | SUBCUTANEOUS | Status: DC
  Administered 2017-06-20: 5 [IU] via SUBCUTANEOUS
  Administered 2017-06-21: 3 [IU] via SUBCUTANEOUS
  Administered 2017-06-21: 2 [IU] via SUBCUTANEOUS

## 2017-06-20 MED ORDER — SODIUM CHLORIDE 0.9 % IV SOLN
INTRAVENOUS | Status: AC
  Filled 2017-06-20: qty 500000

## 2017-06-20 MED ORDER — LIDOCAINE 2% (20 MG/ML) 5 ML SYRINGE
INTRAMUSCULAR | Status: AC
  Filled 2017-06-20: qty 5

## 2017-06-20 MED ORDER — LIDOCAINE HCL (CARDIAC) 20 MG/ML IV SOLN
INTRAVENOUS | Status: DC | PRN
  Administered 2017-06-20: 50 mg via INTRAVENOUS

## 2017-06-20 MED ORDER — CEFAZOLIN SODIUM-DEXTROSE 2-4 GM/100ML-% IV SOLN
INTRAVENOUS | Status: AC
  Filled 2017-06-20: qty 100

## 2017-06-20 MED ORDER — ACETAMINOPHEN 325 MG PO TABS: 650.0000 mg | ORAL_TABLET | ORAL | Status: DC | PRN

## 2017-06-20 MED ORDER — TAMOXIFEN CITRATE 20 MG PO TABS: 20.0000 mg | ORAL_TABLET | Freq: Every day | ORAL | Status: DC

## 2017-06-20 MED ORDER — PHENOL 1.4 % MT LIQD: 1.0000 | OROMUCOSAL | Status: DC | PRN

## 2017-06-20 MED ORDER — METHOCARBAMOL 500 MG PO TABS
500.0000 mg | ORAL_TABLET | Freq: Four times a day (QID) | ORAL | Status: DC | PRN
  Administered 2017-06-20 – 2017-06-21 (×2): 500 mg via ORAL
  Filled 2017-06-20 (×2): qty 1

## 2017-06-20 MED ORDER — MAGNESIUM OXIDE 400 (241.3 MG) MG PO TABS
200.0000 mg | ORAL_TABLET | Freq: Every day | ORAL | Status: DC
  Administered 2017-06-21: 200 mg via ORAL
  Filled 2017-06-20: qty 1

## 2017-06-20 MED ORDER — ASPIRIN EC 81 MG PO TBEC: 81.0000 mg | DELAYED_RELEASE_TABLET | Freq: Every day | ORAL | Status: AC

## 2017-06-20 MED ORDER — ONDANSETRON HCL 4 MG/2ML IJ SOLN
INTRAMUSCULAR | Status: DC | PRN
  Administered 2017-06-20: 4 mg via INTRAVENOUS

## 2017-06-20 MED ORDER — THROMBIN (RECOMBINANT) 5000 UNITS EX SOLR
CUTANEOUS | Status: AC
  Filled 2017-06-20: qty 10000

## 2017-06-20 MED ORDER — PHENYLEPHRINE HCL 10 MG/ML IJ SOLN
INTRAVENOUS | Status: DC | PRN
  Administered 2017-06-20: 25 ug/min via INTRAVENOUS

## 2017-06-20 MED ORDER — SUGAMMADEX SODIUM 200 MG/2ML IV SOLN
INTRAVENOUS | Status: AC
  Filled 2017-06-20: qty 2

## 2017-06-20 MED ORDER — INSULIN ASPART 100 UNIT/ML ~~LOC~~ SOLN
2.0000 [IU] | Freq: Once | SUBCUTANEOUS | Status: AC
  Administered 2017-06-20: 2 [IU] via SUBCUTANEOUS

## 2017-06-20 MED ORDER — DOCUSATE SODIUM 100 MG PO CAPS: 100.0000 mg | ORAL_CAPSULE | Freq: Every day | ORAL | 2 refills | Status: AC | PRN

## 2017-06-20 MED ORDER — ACETAMINOPHEN 10 MG/ML IV SOLN
1000.0000 mg | INTRAVENOUS | Status: AC
  Administered 2017-06-20: 1000 mg via INTRAVENOUS
  Filled 2017-06-20: qty 100

## 2017-06-20 MED ORDER — POLYETHYLENE GLYCOL 3350 17 G PO PACK: 17.0000 g | PACK | Freq: Every day | ORAL | 0 refills | Status: AC

## 2017-06-20 MED ORDER — MAGNESIUM CITRATE PO SOLN: 1.0000 | Freq: Once | ORAL | Status: DC | PRN

## 2017-06-20 MED ORDER — BUPIVACAINE-EPINEPHRINE (PF) 0.5% -1:200000 IJ SOLN
INTRAMUSCULAR | Status: AC
  Filled 2017-06-20: qty 30

## 2017-06-20 MED ORDER — HYDROCODONE-ACETAMINOPHEN 5-325 MG PO TABS: 1.0000 | ORAL_TABLET | Freq: Every day | ORAL | 0 refills | Status: AC | PRN

## 2017-06-20 MED ORDER — VENLAFAXINE HCL ER 75 MG PO CP24
75.0000 mg | ORAL_CAPSULE | Freq: Every day | ORAL | Status: DC
  Administered 2017-06-20 – 2017-06-21 (×2): 75 mg via ORAL
  Filled 2017-06-20 (×2): qty 1

## 2017-06-20 MED ORDER — ALUM & MAG HYDROXIDE-SIMETH 200-200-20 MG/5ML PO SUSP: 30.0000 mL | Freq: Four times a day (QID) | ORAL | Status: DC | PRN

## 2017-06-20 MED ORDER — POLYVINYL ALCOHOL 1.4 % OP SOLN
2.0000 [drp] | Freq: Three times a day (TID) | OPHTHALMIC | Status: DC | PRN
  Filled 2017-06-20: qty 15

## 2017-06-20 MED ORDER — ROCURONIUM BROMIDE 10 MG/ML (PF) SYRINGE
PREFILLED_SYRINGE | INTRAVENOUS | Status: AC
  Filled 2017-06-20: qty 5

## 2017-06-20 MED ORDER — VITAMIN B-12 1000 MCG PO TABS
2500.0000 ug | ORAL_TABLET | Freq: Every day | ORAL | Status: DC
  Administered 2017-06-21: 2500 ug via ORAL
  Filled 2017-06-20: qty 3

## 2017-06-20 MED ORDER — PROPOFOL 10 MG/ML IV BOLUS
INTRAVENOUS | Status: DC | PRN
  Administered 2017-06-20: 110 mg via INTRAVENOUS
  Administered 2017-06-20: 20 mg via INTRAVENOUS

## 2017-06-20 MED ORDER — POTASSIUM CHLORIDE IN NACL 20-0.9 MEQ/L-% IV SOLN
INTRAVENOUS | Status: AC
  Administered 2017-06-20 – 2017-06-21 (×3): via INTRAVENOUS
  Filled 2017-06-20 (×2): qty 1000

## 2017-06-20 MED ORDER — ROCURONIUM BROMIDE 100 MG/10ML IV SOLN
INTRAVENOUS | Status: DC | PRN
  Administered 2017-06-20 (×3): 10 mg via INTRAVENOUS
  Administered 2017-06-20: 50 mg via INTRAVENOUS

## 2017-06-20 MED ORDER — ACETAMINOPHEN 10 MG/ML IV SOLN
1000.0000 mg | Freq: Four times a day (QID) | INTRAVENOUS | Status: AC
  Administered 2017-06-20 – 2017-06-21 (×4): 1000 mg via INTRAVENOUS
  Filled 2017-06-20 (×4): qty 100

## 2017-06-20 MED ORDER — LOSARTAN POTASSIUM 50 MG PO TABS: 100.0000 mg | ORAL_TABLET | Freq: Every day | ORAL | Status: DC

## 2017-06-20 MED ORDER — LACTATED RINGERS IV SOLN
INTRAVENOUS | Status: DC
  Administered 2017-06-20: 12:00:00 via INTRAVENOUS
  Administered 2017-06-20: 1000 mL via INTRAVENOUS

## 2017-06-20 MED ORDER — PANTOPRAZOLE SODIUM 40 MG PO TBEC
40.0000 mg | DELAYED_RELEASE_TABLET | Freq: Every day | ORAL | Status: DC
  Administered 2017-06-20 – 2017-06-21 (×2): 40 mg via ORAL
  Filled 2017-06-20 (×2): qty 1

## 2017-06-20 MED ORDER — HYDROCHLOROTHIAZIDE 25 MG PO TABS
25.0000 mg | ORAL_TABLET | Freq: Every day | ORAL | Status: DC
  Administered 2017-06-20: 25 mg via ORAL
  Filled 2017-06-20: qty 1

## 2017-06-20 MED ORDER — CEFAZOLIN SODIUM-DEXTROSE 2-4 GM/100ML-% IV SOLN
2.0000 g | INTRAVENOUS | Status: AC
  Administered 2017-06-20: 2 g via INTRAVENOUS

## 2017-06-20 MED ORDER — PROPOFOL 10 MG/ML IV BOLUS
INTRAVENOUS | Status: AC
  Filled 2017-06-20: qty 20

## 2017-06-20 MED ORDER — DEXAMETHASONE SODIUM PHOSPHATE 10 MG/ML IJ SOLN
INTRAMUSCULAR | Status: AC
  Filled 2017-06-20: qty 1

## 2017-06-20 MED ORDER — RISAQUAD PO CAPS
1.0000 | ORAL_CAPSULE | Freq: Every day | ORAL | Status: DC
  Administered 2017-06-21: 1 via ORAL
  Filled 2017-06-20: qty 1

## 2017-06-20 MED ORDER — DEXAMETHASONE SODIUM PHOSPHATE 10 MG/ML IJ SOLN
INTRAMUSCULAR | Status: DC | PRN
  Administered 2017-06-20: 10 mg via INTRAVENOUS

## 2017-06-20 MED ORDER — CARBOXYMETHYLCELLUL-GLYCERIN 0.5-0.9 % OP SOLN: 2.0000 [drp] | Freq: Three times a day (TID) | OPHTHALMIC | Status: DC | PRN

## 2017-06-20 MED ORDER — DOCUSATE SODIUM 100 MG PO CAPS
100.0000 mg | ORAL_CAPSULE | Freq: Two times a day (BID) | ORAL | Status: DC
  Administered 2017-06-20 – 2017-06-21 (×2): 100 mg via ORAL
  Filled 2017-06-20 (×2): qty 1

## 2017-06-20 MED ORDER — ONDANSETRON HCL 4 MG/2ML IJ SOLN
INTRAMUSCULAR | Status: AC
  Filled 2017-06-20: qty 2

## 2017-06-20 MED ORDER — THROMBIN (RECOMBINANT) 5000 UNITS EX SOLR
CUTANEOUS | Status: DC | PRN
  Administered 2017-06-20: 1000 [IU] via TOPICAL

## 2017-06-20 MED ORDER — CEFAZOLIN SODIUM-DEXTROSE 2-4 GM/100ML-% IV SOLN
2.0000 g | Freq: Three times a day (TID) | INTRAVENOUS | Status: AC
  Administered 2017-06-20 – 2017-06-21 (×3): 2 g via INTRAVENOUS
  Filled 2017-06-20 (×3): qty 100

## 2017-06-20 MED ORDER — METHOCARBAMOL 1000 MG/10ML IJ SOLN
500.0000 mg | Freq: Four times a day (QID) | INTRAVENOUS | Status: DC | PRN
  Administered 2017-06-20: 500 mg via INTRAVENOUS
  Filled 2017-06-20: qty 550

## 2017-06-20 SURGICAL SUPPLY — 52 items
AGENT HMST SPONGE THK3/8 (HEMOSTASIS)
BAG SPEC THK2 15X12 ZIP CLS (MISCELLANEOUS)
BAG ZIPLOCK 12X15 (MISCELLANEOUS) IMPLANT
CLEANER TIP ELECTROSURG 2X2 (MISCELLANEOUS) ×2 IMPLANT
CLOTH 2% CHLOROHEXIDINE 3PK (PERSONAL CARE ITEMS) ×2 IMPLANT
COVER SURGICAL LIGHT HANDLE (MISCELLANEOUS) ×2 IMPLANT
DRAPE INCISE IOBAN 66X45 STRL (DRAPES) ×1 IMPLANT
DRAPE MICROSCOPE LEICA (MISCELLANEOUS) ×2 IMPLANT
DRAPE SHEET LG 3/4 BI-LAMINATE (DRAPES) IMPLANT
DRAPE SURG 17X11 SM STRL (DRAPES) ×2 IMPLANT
DRAPE UTILITY XL STRL (DRAPES) ×2 IMPLANT
DRSG AQUACEL AG ADV 3.5X 4 (GAUZE/BANDAGES/DRESSINGS) IMPLANT
DRSG AQUACEL AG ADV 3.5X 6 (GAUZE/BANDAGES/DRESSINGS) ×1 IMPLANT
DURAPREP 26ML APPLICATOR (WOUND CARE) ×2 IMPLANT
DURASEAL SPINE SEALANT 3ML (MISCELLANEOUS) IMPLANT
ELECT BLADE TIP CTD 4 INCH (ELECTRODE) IMPLANT
ELECT REM PT RETURN 15FT ADLT (MISCELLANEOUS) ×2 IMPLANT
GLOVE BIOGEL PI IND STRL 7.0 (GLOVE) ×1 IMPLANT
GLOVE BIOGEL PI INDICATOR 7.0 (GLOVE) ×1
GLOVE SURG SS PI 7.0 STRL IVOR (GLOVE) ×2 IMPLANT
GLOVE SURG SS PI 8.0 STRL IVOR (GLOVE) ×6 IMPLANT
GOWN STRL REUS W/TWL XL LVL3 (GOWN DISPOSABLE) ×5 IMPLANT
HEMOSTAT SPONGE AVITENE ULTRA (HEMOSTASIS) IMPLANT
IV CATH 14GX2 1/4 (CATHETERS) IMPLANT
KIT BASIN OR (CUSTOM PROCEDURE TRAY) ×2 IMPLANT
KIT POSITIONING SURG ANDREWS (MISCELLANEOUS) ×2 IMPLANT
MANIFOLD NEPTUNE II (INSTRUMENTS) ×2 IMPLANT
MARKER SKIN DUAL TIP RULER LAB (MISCELLANEOUS) ×2 IMPLANT
NDL SPNL 18GX3.5 QUINCKE PK (NEEDLE) ×2 IMPLANT
NEEDLE SPNL 18GX3.5 QUINCKE PK (NEEDLE) ×6 IMPLANT
PACK LAMINECTOMY ORTHO (CUSTOM PROCEDURE TRAY) ×2 IMPLANT
PATTIES SURGICAL .5 X.5 (GAUZE/BANDAGES/DRESSINGS) ×2 IMPLANT
PATTIES SURGICAL .75X.75 (GAUZE/BANDAGES/DRESSINGS) ×2 IMPLANT
PATTIES SURGICAL 1X1 (DISPOSABLE) IMPLANT
PUTTY DBM ALLOSYNC PURE 10CC (Putty) ×1 IMPLANT
RUBBERBAND STERILE (MISCELLANEOUS) ×2 IMPLANT
SPONGE LAP 4X18 X RAY DECT (DISPOSABLE) ×1 IMPLANT
SPONGE SURGIFOAM ABS GEL 100 (HEMOSTASIS) ×3 IMPLANT
STAPLER VISISTAT (STAPLE) IMPLANT
STRIP CLOSURE SKIN 1/2X4 (GAUZE/BANDAGES/DRESSINGS) ×1 IMPLANT
SUT NURALON 4 0 TR CR/8 (SUTURE) IMPLANT
SUT PROLENE 3 0 PS 2 (SUTURE) ×1 IMPLANT
SUT VIC AB 1 CT1 27 (SUTURE)
SUT VIC AB 1 CT1 27XBRD ANTBC (SUTURE) IMPLANT
SUT VIC AB 1-0 CT2 27 (SUTURE) ×2 IMPLANT
SUT VIC AB 2-0 CT1 27 (SUTURE) ×4
SUT VIC AB 2-0 CT1 TAPERPNT 27 (SUTURE) IMPLANT
SUT VIC AB 2-0 CT2 27 (SUTURE) IMPLANT
SYR 3ML LL SCALE MARK (SYRINGE) IMPLANT
TOWEL OR 17X26 10 PK STRL BLUE (TOWEL DISPOSABLE) ×2 IMPLANT
TOWEL OR NON WOVEN STRL DISP B (DISPOSABLE) ×2 IMPLANT
YANKAUER SUCT BULB TIP NO VENT (SUCTIONS) ×2 IMPLANT

## 2017-06-20 NOTE — Anesthesia Preprocedure Evaluation (Addendum)
Anesthesia Evaluation  Patient identified by MRN, date of birth, ID band Patient awake    Reviewed: Allergy & Precautions, H&P , NPO status , Patient's Chart, lab work & pertinent test results, reviewed documented beta blocker date and time   Airway Mallampati: III  TM Distance: >3 FB Neck ROM: Full    Dental no notable dental hx. (+) Teeth Intact, Dental Advisory Given   Pulmonary sleep apnea ,    Pulmonary exam normal breath sounds clear to auscultation       Cardiovascular hypertension, Pt. on medications and Pt. on home beta blockers  Rhythm:Regular Rate:Normal     Neuro/Psych negative neurological ROS  negative psych ROS   GI/Hepatic Neg liver ROS, GERD  Medicated and Controlled,  Endo/Other  diabetes, Type 2, Oral Hypoglycemic Agents  Renal/GU negative Renal ROS  negative genitourinary   Musculoskeletal   Abdominal   Peds  Hematology negative hematology ROS (+)   Anesthesia Other Findings   Reproductive/Obstetrics negative OB ROS                            Anesthesia Physical Anesthesia Plan  ASA: III  Anesthesia Plan: General   Post-op Pain Management:    Induction: Intravenous  PONV Risk Score and Plan: 4 or greater and Ondansetron, Treatment may vary due to age or medical condition and Dexamethasone  Airway Management Planned: Oral ETT  Additional Equipment:   Intra-op Plan:   Post-operative Plan: Extubation in OR  Informed Consent: I have reviewed the patients History and Physical, chart, labs and discussed the procedure including the risks, benefits and alternatives for the proposed anesthesia with the patient or authorized representative who has indicated his/her understanding and acceptance.   Dental advisory given  Plan Discussed with: CRNA and Surgeon  Anesthesia Plan Comments:         Anesthesia Quick Evaluation

## 2017-06-20 NOTE — Transfer of Care (Signed)
Immediate Anesthesia Transfer of Care Note  Patient: Paige Johnston  Procedure(s) Performed: Microlumbar decompression L4-5, L3-4, lateral mass fusion L4-5 with allograft and autograft bone (N/A )  Patient Location: PACU  Anesthesia Type:General  Level of Consciousness: awake, alert  and oriented  Airway & Oxygen Therapy: Patient Spontanous Breathing and Patient connected to face mask oxygen  Post-op Assessment: Report given to RN and Post -op Vital signs reviewed and stable  Post vital signs: Reviewed and stable  Last Vitals:  Vitals:   06/20/17 0815  BP: (!) 172/91  Pulse: 78  Resp: 18  Temp: 37.1 C  SpO2: 94%    Last Pain:  Vitals:   06/20/17 0815  TempSrc: Oral      Patients Stated Pain Goal: 4 (50/38/88 2800)  Complications: No apparent anesthesia complications

## 2017-06-20 NOTE — Anesthesia Procedure Notes (Signed)
Procedure Name: Intubation Date/Time: 06/20/2017 11:18 AM Performed by: Glory Buff, CRNA Pre-anesthesia Checklist: Patient identified, Emergency Drugs available, Suction available and Patient being monitored Patient Re-evaluated:Patient Re-evaluated prior to induction Oxygen Delivery Method: Circle system utilized Preoxygenation: Pre-oxygenation with 100% oxygen Induction Type: IV induction Ventilation: Mask ventilation without difficulty Laryngoscope Size: Miller and 3 Grade View: Grade III Tube type: Oral Tube size: 7.0 mm Number of attempts: 2 Airway Equipment and Method: Oral airway and Bougie stylet Placement Confirmation: ETT inserted through vocal cords under direct vision,  positive ETCO2 and breath sounds checked- equal and bilateral Secured at: 20 cm Tube secured with: Tape Dental Injury: Teeth and Oropharynx as per pre-operative assessment  Difficulty Due To: Difficult Airway- due to anterior larynx, Difficult Airway- due to reduced neck mobility and Difficulty was unanticipated Future Recommendations: Recommend- induction with short-acting agent, and alternative techniques readily available

## 2017-06-20 NOTE — Discharge Instructions (Signed)

## 2017-06-20 NOTE — Anesthesia Postprocedure Evaluation (Signed)
Anesthesia Post Note  Patient: Paige Johnston  Procedure(s) Performed: [place] Reap decompression L4-5, L3-4, lateral mass fusion L4-5 with allograft and autograft bone (N/A )     Patient location during evaluation: PACU Anesthesia Type: General Level of consciousness: awake and alert Pain management: pain level controlled Vital Signs Assessment: post-procedure vital signs reviewed and stable Respiratory status: spontaneous breathing, nonlabored ventilation, respiratory function stable and patient connected to nasal cannula oxygen Cardiovascular status: blood pressure returned to baseline and stable Postop Assessment: no apparent nausea or vomiting Anesthetic complications: no    Last Vitals:  Vitals:   06/20/17 1412 06/20/17 1414  BP: (!) 147/77   Pulse: 74 75  Resp: 12 14  Temp:    SpO2: 99% 100%    Last Pain:  Vitals:   06/20/17 1321  TempSrc:   PainSc: 0-No pain    LLE Motor Response: Responds to commands (06/20/17 1414) LLE Sensation: Full sensation (06/20/17 1414) RLE Motor Response: Responds to commands (06/20/17 1414) RLE Sensation: Full sensation (06/20/17 1414)      Paige Johnston,W. EDMOND

## 2017-06-20 NOTE — Interval H&P Note (Signed)
History and Physical Interval Note:  06/20/2017 10:42 AM  Paige Johnston  has presented today for surgery, with the diagnosis of Spinal stenosis, spondylolisthesis  The various methods of treatment have been discussed with the patient and family. After consideration of risks, benefits and other options for treatment, the patient has consented to  Procedure(s) with comments: Microlumbar decompression L4-5, L3-4, lateral mass fusion L4-5 with allograft and autograft bone (N/A) - 120 mins as a surgical intervention .  The patient's history has been reviewed, patient examined, no change in status, stable for surgery.  I have reviewed the patient's chart and labs.  Questions were answered to the patient's satisfaction.     Coran Dipaola C

## 2017-06-20 NOTE — Evaluation (Signed)
Physical Therapy Evaluation Patient Details Name: Paige Johnston MRN: [id number] DOB: 09/25/1933 Today's Date: 06/20/2017   History of Present Illness  84 y.o. female admitted on 06/20/17 for elective lumbar decompression and fusion L3-5.  Pt with significant PMH of HTN, breast CA, and DM.  Clinical Impression  Pt is POD #0 and was able to walk up to the desk and back with light use of RW.  Next session she should be able to ambulate without it (she did not use and AD PTA).  Back education initiated and handout given, but will need to be reinforced.  PT to follow acutely until d/c confirmed.       Follow Up Recommendations Follow surgeon's recommendation for DC plan and follow-up therapies;Supervision for mobility/OOB    Equipment Recommendations  None recommended by PT    Recommendations for Other Services   NA    Precautions / Restrictions Precautions Precautions: Back Precaution Booklet Issued: Yes (comment) Precaution Comments: back education handout given, precautions reviewed with pt and her daughter.  Required Braces or Orthoses: Other Brace/Splint Other Brace/Splint: Chart says no brace, but pt reports she will be fitted for a brace.      Mobility  Bed Mobility Overal bed mobility: Needs Assistance Bed Mobility: Rolling;Sidelying to Sit Rolling: Min assist Sidelying to sit: Min assist       General bed mobility comments: Min assist to help transition trunk, educating on log roll technique to get out of the right side of the bed simulating her bed at home.   Transfers Overall transfer level: Needs assistance Equipment used: Rolling walker (2 wheeled) Transfers: Sit to/from Stand Sit to Stand: Supervision         General transfer comment: supervision for safety.  RW adjusted down for height.   Ambulation/Gait Ambulation/Gait assistance: Supervision Ambulation Distance (Feet): 150 Feet Assistive device: Rolling walker (2 wheeled) Gait Pattern/deviations:  WFL(Within Functional Limits)     General Gait Details: Pt with easy gait cadance and good, upright posture.  She really was not using the RW for any support, so we walked the last 15' without it without difficulty.  Will d/c RW tomorrow.          Balance Overall balance assessment: Needs assistance Sitting-balance support: Feet supported;No upper extremity supported Sitting balance-Leahy Scale: Good     Standing balance support: Bilateral upper extremity supported;No upper extremity supported Standing balance-Leahy Scale: Good                               Pertinent Vitals/Pain Pain Assessment: Faces Faces Pain Scale: Hurts little more Pain Location: low back with transition to sitting Pain Descriptors / Indicators: Aching;Burning Pain Intervention(s): Limited activity within patient's tolerance;Monitored during session;Repositioned    Home Living Family/patient expects to be discharged to:: Private residence Living Arrangements: Alone Available Help at Discharge: Family;Available 24 hours/day Type of Home: House Home Access: Level entry     Home Layout: One level Home Equipment: Walker - 2 wheels;Cane - single point;Toilet riser      Prior Function Level of Independence: Independent                  Extremity/Trunk Assessment   Upper Extremity Assessment Upper Extremity Assessment: Defer to OT evaluation    Lower Extremity Assessment Lower Extremity Assessment: Overall WFL for tasks assessed(no burning nerve pain on the left )    Cervical / Trunk Assessment Cervical / Trunk Assessment:  Other exceptions Cervical / Trunk Exceptions: Pt reports PTA she had burning sciatic nerve pain down her left leg.  Communication   Communication: No difficulties  Cognition Arousal/Alertness: Awake/alert Behavior During Therapy: WFL for tasks assessed/performed Overall Cognitive Status: Within Functional Limits for tasks assessed                                            Exercises Other Exercises Other Exercises: encouraged ankle pumps for circulation   Assessment/Plan    PT Assessment Patient needs continued PT services  PT Problem List Decreased activity tolerance;Decreased balance;Decreased mobility;Decreased knowledge of use of DME;Decreased knowledge of precautions;Pain       PT Treatment Interventions DME instruction;Gait training;Stair training;Functional mobility training;Therapeutic activities;Therapeutic exercise;Balance training;Neuromuscular re-education;Patient/family education    PT Goals (Current goals can be found in the Care Plan section)  Acute Rehab PT Goals Patient Stated Goal: to get back to her normal routiene of taking care of herself.  PT Goal Formulation: With patient Time For Goal Achievement: 06/27/17 Potential to Achieve Goals: Good    Frequency Min 5X/week           AM-PAC PT "6 Clicks" Daily Activity  Outcome Measure Difficulty turning over in bed (including adjusting bedclothes, sheets and blankets)?: Unable Difficulty moving from lying on back to sitting on the side of the bed? : Unable Difficulty sitting down on and standing up from a chair with arms (e.g., wheelchair, bedside commode, etc,.)?: None Help needed moving to and from a bed to chair (including a wheelchair)?: None Help needed walking in hospital room?: None Help needed climbing 3-5 steps with a railing? : A Little 6 Click Score: 17    End of Session Equipment Utilized During Treatment: Gait belt Activity Tolerance: Patient tolerated treatment well Patient left: in chair;with call bell/phone within reach;with chair alarm set;with family/visitor present Nurse Communication: Mobility status PT Visit Diagnosis: Difficulty in walking, not elsewhere classified (R26.2);Pain;Other symptoms and signs involving the nervous system (R29.898) Pain - Right/Left: (lower) Pain - part of body: (back)    Time: 9480-1655 PT  Time Calculation (min) (ACUTE ONLY): 21 min   Charges:       Wells Guiles B. Raenah Murley, PT, DPT [phone]   PT Evaluation $PT Eval Moderate Complexity: 1 Mod     06/20/2017, 5:41 PM

## 2017-06-20 NOTE — Progress Notes (Signed)
06/20/17 2145 [place] called reg cbg 243 at bedtime. Order received for 2 units novolog insulin.

## 2017-06-20 NOTE — Brief Op Note (Signed)
06/20/2017  1:03 PM  PATIENT:  Paige Johnston  84 y.o. female  PRE-OPERATIVE DIAGNOSIS:  Spinal stenosis, spondylolisthesis  POST-OPERATIVE DIAGNOSIS:  Spinal stenosis, spondylolisthesis  PROCEDURE:  Procedure(s) with comments: Microlumbar decompression L4-5, L3-4, lateral mass fusion L4-5 with allograft and autograft bone (N/A) - 120 mins  SURGEON:  Surgeon(s) and Role:    Susa Day, MD - Primary  PHYSICIAN ASSISTANT:   ASSISTANTS: [place]   ANESTHESIA:   general  EBL:  200 mL   BLOOD ADMINISTERED:none  DRAINS: none   LOCAL MEDICATIONS USED:  MARCAINE     SPECIMEN:  No Specimen  DISPOSITION OF SPECIMEN:  N/A  COUNTS:  YES  TOURNIQUET:  * No tourniquets in log *  DICTATION: .Other Dictation: Dictation Number [id number]  PLAN OF CARE: Admit to inpatient   PATIENT DISPOSITION:  PACU - hemodynamically stable.   Delay start of Pharmacological VTE agent (>24hrs) due to surgical blood loss or risk of bleeding: yes

## 2017-06-21 ENCOUNTER — Encounter (HOSPITAL_COMMUNITY): Payer: Self-pay | Admitting: Specialist

## 2017-06-21 DIAGNOSIS — M4316 Spondylolisthesis, lumbar region: Secondary | ICD-10-CM | POA: Diagnosis not present

## 2017-06-21 DIAGNOSIS — G4730 Sleep apnea, unspecified: Secondary | ICD-10-CM | POA: Diagnosis not present

## 2017-06-21 DIAGNOSIS — M48061 Spinal stenosis, lumbar region without neurogenic claudication: Secondary | ICD-10-CM | POA: Diagnosis present

## 2017-06-21 DIAGNOSIS — M5416 Radiculopathy, lumbar region: Secondary | ICD-10-CM | POA: Diagnosis not present

## 2017-06-21 DIAGNOSIS — M48062 Spinal stenosis, lumbar region with neurogenic claudication: Secondary | ICD-10-CM | POA: Diagnosis not present

## 2017-06-21 LAB — BASIC METABOLIC PANEL
Anion gap: 10 (ref 5–15)
BUN: 14 mg/dL (ref 6–20)
CO2: 25 mmol/L (ref 22–32)
Calcium: 8.7 mg/dL — ABNORMAL LOW (ref 8.9–10.3)
Chloride: 101 mmol/L (ref 101–111)
Creatinine, Ser: 0.7 mg/dL (ref 0.44–1.00)
GFR calc Af Amer: >60 mL/min (ref >60)
GLUCOSE: 180 mg/dL — AB (ref 65–99)
POTASSIUM: 4.1 mmol/L (ref 3.5–5.1)
Sodium: 136 mmol/L (ref 135–145)

## 2017-06-21 LAB — GLUCOSE, CAPILLARY
Glucose-Capillary: 136 mg/dL — ABNORMAL HIGH (ref 65–99)
Glucose-Capillary: 165 mg/dL — ABNORMAL HIGH (ref 65–99)

## 2017-06-21 LAB — CBC
HEMATOCRIT: 34.1 % — AB (ref 36.0–46.0)
HEMOGLOBIN: 11.7 g/dL — AB (ref 12.0–15.0)
MCH: 31.4 pg (ref 26.0–34.0)
MCHC: 34.3 g/dL (ref 30.0–36.0)
MCV: 91.4 fL (ref 78.0–100.0)
Platelets: 161 10*3/uL (ref 150–400)
RBC: 3.73 MIL/uL — ABNORMAL LOW (ref 3.87–5.11)
RDW: 13 % (ref 11.5–15.5)
WBC: 9.7 10*3/uL (ref 4.0–10.5)

## 2017-06-21 NOTE — Care Management CC44 (Signed)
Condition Code 44 Documentation Completed  Patient Details  Name: Paige Johnston MRN: [id number] Date of Birth: Jul 31, 1933   Condition Code 44 given:  Yes Patient signature on Condition Code 44 notice:  Yes Documentation of 2 MD's agreement:  Yes Code 44 added to claim:  Yes    Elton Heid, Benjaman Lobe, RN 06/21/2017, 4:48 PM

## 2017-06-21 NOTE — Discharge Summary (Signed)
Physician Discharge Summary   Patient ID: Paige Johnston MRN: [id number] DOB/AGE: 82-Apr-1935 82 y.o.  Admit date: 06/20/2017 Discharge date: 06/21/2017  Primary Diagnosis:   Spinal stenosis, spondylolisthesis  Admission Diagnoses:  Past Medical History:  Diagnosis Date  . Cancer ([place])    breast  . Diabetes mellitus    TYPE 2  . GERD (gastroesophageal reflux disease)   . History of breast cancer   . hx: breast cancer, left UOQ, invasive ductal carcinoma, receptor + her 2 - 01/06/2007  . Hyperlipidemia   . Hypertension   . Neuromuscular disorder ([facility])    sciatica  . Psoriasis   . Restless leg   . Sleep apnea    MILD CASE; NO CPAP USE    Discharge Diagnoses:   Principal Problem:   Spinal stenosis of lumbar region Active Problems:   Spinal stenosis at L4-L5 level   Lumbar spinal stenosis  Procedure:  Procedure(s) (LRB): Microlumbar decompression L4-5, L3-4, lateral mass fusion L4-5 with allograft and autograft bone (N/A)   Consults: None  HPI:  see H&P    Laboratory Data: Hospital Outpatient Visit on 06/17/2017  Component Date Value Ref Range Status  . Specimen Description 06/17/2017    Final                   Value:NOSE Performed at [facility], [place] [address], [place], [place] [address]   . Special Requests 06/17/2017    Final                   Value:NONE Performed at [facility], [place] [address] [place], [place] [address]   . Culture 06/17/2017    Final                   Value:NO MRSA DETECTED Performed at [facility], [place] [address] [place], [place] [address]   . Report Status 06/17/2017 06/19/2017 FINAL   Final   Recent Labs    06/21/17 0537  HGB 11.7*   Recent Labs    06/21/17 0537  WBC 9.7  RBC 3.73*  HCT 34.1*  PLT 161   Recent Labs    06/21/17 0537  NA 136  K 4.1  CL 101  CO2 25  BUN 14  CREATININE 0.70  GLUCOSE 180*  CALCIUM 8.7*   No results for input(s): LABPT, INR in the last 72  hours.  X-Rays:Dg Lumbar Spine 2-3 Views  Result Date: 06/17/2017 CLINICAL DATA:  84 year old female with chronic back pain. Preop exam. Initial encounter. EXAM: LUMBAR SPINE - 2-3 VIEW COMPARISON:  04/11/2008 MR. FINDINGS: Last disc space labeled L5-S1 as per prior MR. Correlation with any outside MR or CT recommended to confirm this level assignment. Calcified metastatic disease within liver suspected as noted on prior abdominal MRIs. No osseous destructive lesion identified. Curvature lumbar spine convex right. Mild L1-2 and L2-3 disc space narrowing. Moderate left-sided and mild right-sided L3-4 disc space narrowing. Facet degenerative changes L4-5 with mild anterior slip L4. Moderate L4-5 disc space narrowing. Marked L5-S1 disc space narrowing. Vascular calcifications. IMPRESSION: Multilevel degenerative changes throughout the lumbar spine as detailed above. Curvature lumbar spine convex right. Probable calcified metastatic liver lesions right upper quadrant. Aortic Atherosclerosis (ICD10-I70.0). Electronically Signed   By: Genia Del M.D.   On: 06/17/2017 20:00   Dg Spine Portable 1 View  Result Date: 06/20/2017 CLINICAL DATA:  Laminectomy EXAM: PORTABLE SPINE - 1 VIEW COMPARISON:  Study obtained earlier  in the day FINDINGS: Cross-table lateral lumbar image labeled #3 submitted. Metallic probe tips are posterior to the L3-4 interspace level and posterior at L5 vertebral body. Cutting tool overlies the L4 spinous process. There is anterolisthesis of L3 on L4 and anterolisthesis of L4 on L5. No fracture. There is aortic atherosclerosis. IMPRESSION: Metallic probe tips are posterior to the L3-4 interspace level and midportion of L5 vertebral body respectively. Cutting tool overlies the L4 spinous process. There is spondylolisthesis at L3-4 and L4-5. No fracture. There is aortic atherosclerosis. Aortic Atherosclerosis (ICD10-I70.0). Electronically Signed   By: Lowella Grip III M.D.   On: 06/20/2017  12:45   Dg Spine Portable 1 View  Result Date: 06/20/2017 CLINICAL DATA:  Elective surgery, L4-5 EXAM: PORTABLE SPINE - 1 VIEW COMPARISON:  06/17/2017 FINDINGS: Posterior surgical instruments extend from L4-L5. IMPRESSION: Intraoperative localization as above. Electronically Signed   By: Rolm Baptise M.D.   On: 06/20/2017 11:58   Dg Spine Portable 1 View  Result Date: 06/20/2017 CLINICAL DATA:  Elective surgery, L4-5 EXAM: PORTABLE SPINE - 1 VIEW COMPARISON:  06/17/2017 FINDINGS: The posterior needles are directed at L5-S1 and L4-5. IMPRESSION: Intraoperative localization as above. Electronically Signed   By: Rolm Baptise M.D.   On: 06/20/2017 11:44    EKG: Orders placed or performed during the hospital encounter of 06/17/17  . EKG 12 lead  . EKG 12 lead     Hospital Course: Patient was admitted to [facility] and taken to the OR and underwent the above state procedure without complications.  Patient tolerated the procedure well and was later transferred to the recovery room and then to the orthopaedic floor for postoperative care.  They were given PO and IV analgesics for pain control following their surgery.  They were given 24 hours of postoperative antibiotics.   PT was consulted postop to assist with mobility and transfers.  The patient was allowed to be WBAT with therapy and was taught back precautions. Discharge planning was consulted to help with postop disposition and equipment needs.  Patient had a good night on the evening of surgery and started to get up OOB with therapy on day one. Patient was seen in rounds and was ready to go home on day one.  They were given discharge instructions and dressing directions.  They were instructed on when to follow up in the office with Dr. Tonita Cong.   Diet: Diabetic diet Activity:WBAT; Lspine precautions; use Aspen back brace Follow-up:in 10-14 days Disposition - Home Discharged Condition: good   Discharge Instructions    Call MD / Call  911   Complete by:  As directed    If you experience chest pain or shortness of breath, CALL 911 and be transported to the hospital emergency room.  If you develope a fever above 101 F, pus (white drainage) or increased drainage or redness at the wound, or calf pain, call your surgeon's office.   Constipation Prevention   Complete by:  As directed    Drink plenty of fluids.  Prune juice may be helpful.  You may use a stool softener, such as Colace (over the counter) 100 mg twice a day.  Use MiraLax (over the counter) for constipation as needed.   Diet - low sodium heart healthy   Complete by:  As directed    Increase activity slowly as tolerated   Complete by:  As directed      Allergies as of 06/21/2017   No Known Allergies  Medication List    STOP taking these medications   fish oil-omega-3 fatty acids 1000 MG capsule   simvastatin 40 MG tablet Commonly known as:  ZOCOR     TAKE these medications   aspirin EC 81 MG tablet Take 1-2 tablets (81-162 mg total) by mouth daily. Resume 4 days post-op What changed:  additional instructions   B-12 2500 MCG Tabs Take 2,500 mcg by mouth daily.   cholecalciferol 1000 units tablet Commonly known as:  VITAMIN D Take 1,000 Units by mouth daily.   diphenhydramine-acetaminophen 25-500 MG Tabs tablet Commonly known as:  TYLENOL PM Take 2 tablets by mouth at bedtime as needed.   docusate sodium 100 MG capsule Commonly known as:  COLACE Take 1 capsule (100 mg total) by mouth daily as needed.   freestyle lancets 1 each by Other route as needed. Use as instructed   FREESTYLE LITE test strip Generic drug:  glucose blood 1 each by Other route as needed. Use as instructed   hydrochlorothiazide 25 MG tablet Commonly known as:  HYDRODIURIL Take 25 mg by mouth daily with supper.   HYDROcodone-acetaminophen 5-325 MG tablet Commonly known as:  NORCO/VICODIN Take 1-2 tablets by mouth daily as needed for moderate pain. What changed:   how much to take   hydrocortisone 2.5 % cream Apply topically 2 (two) times daily.   losartan 100 MG tablet Commonly known as:  COZAAR Take 100 mg by mouth daily.   LUBRICATING EYE DROPS OP Place 1 drop into both eyes daily as needed (dry eyes).   Magnesium 250 MG Tabs Take 250 mg by mouth daily.   metFORMIN 1000 MG tablet Commonly known as:  GLUCOPHAGE Take 1,000 mg by mouth 2 (two) times daily with a meal.   metoprolol succinate 100 MG 24 hr tablet Commonly known as:  TOPROL-XL Take 100 mg by mouth daily with supper. Take with or immediately following a meal.   multivitamin tablet Take 1 tablet by mouth daily.   omeprazole 40 MG capsule Commonly known as:  PRILOSEC Take 40 mg by mouth daily with supper.   polyethylene glycol packet Commonly known as:  MIRALAX Take 17 g by mouth daily.   tamoxifen 20 MG tablet Commonly known as:  NOLVADEX Take 1 tablet (20 mg total) by mouth daily.   venlafaxine XR 75 MG 24 hr capsule Commonly known as:  EFFEXOR-XR Take 75 mg by mouth daily with supper.      Follow-up Information    Susa Day, MD Follow up in 2 week(s).   Specialty:  Orthopedic Surgery Contact information: [address] [place] [address] [phone]           Signed: Lacie Draft, PA-C Orthopaedic Surgery 06/21/2017, 4:09 PM

## 2017-06-21 NOTE — Care Management Obs Status (Addendum)
[facility] NOTIFICATION   Patient Details  Name: Paige Johnston MRN: [id number] Date of Birth: [birth year]   Medicare Observation Status Notification Given:       Rae Mar, RN 06/21/2017, 4:47 PM

## 2017-06-21 NOTE — Progress Notes (Signed)
Subjective: 1 Day Post-Op Procedure(s) (LRB): Microlumbar decompression L4-5, L3-4, lateral mass fusion L4-5 with allograft and autograft bone (N/A) Patient reports pain as moderate.    Objective: Vital signs in last 24 hours: Temp:  [97.5 F (36.4 C)-98.7 F (37.1 C)] 98.1 F (36.7 C) (02/22 0451) Pulse Rate:  [67-88] 67 (02/22 0451) Resp:  [11-20] 14 (02/22 0451) BP: (103-157)/(68-86) 119/86 (02/22 0451) SpO2:  [96 %-100 %] 98 % (02/22 0451)  Intake/Output from previous day: 02/21 0701 - 02/22 0700 In: 3475.8 [P.O.:660; I.V.:2115.8; IV Piggyback:700] Out: 2563 [Urine:3850; Blood:200] Intake/Output this shift: No intake/output data recorded.  Recent Labs    06/21/17 0537  HGB 11.7*   Recent Labs    06/21/17 0537  WBC 9.7  RBC 3.73*  HCT 34.1*  PLT 161   Recent Labs    06/21/17 0537  NA 136  K 4.1  CL 101  CO2 25  BUN 14  CREATININE 0.70  GLUCOSE 180*  CALCIUM 8.7*   No results for input(s): LABPT, INR in the last 72 hours.  Neurologically intact ABD soft Neurovascular intact Sensation intact distally Intact pulses distally Dorsiflexion/Plantar flexion intact Incision: dressing C/D/I and no drainage No cellulitis present Compartment soft no calf pain or sign of DVT. Aspen brace in place.  Assessment/Plan: 1 Day Post-Op Procedure(s) (LRB): Microlumbar decompression L4-5, L3-4, lateral mass fusion L4-5 with allograft and autograft bone (N/A) Advance diet Up with therapy D/C IV fluids  Discussed D/C instructions Possible D/C later today if voiding without difficulty, pain well controlled and tolerates PT well otherwise plan for tomorrow  Lacie Draft M. 06/21/2017, 8:50 AM

## 2017-06-21 NOTE — Op Note (Signed)
NAME:  Paige Johnston, Paige Johnston                      ACCOUNT NO.:  MEDICAL RECORD NO.:  [id number]  LOCATION:                                 FACILITY:  PHYSICIAN:  Susa Day, M.D.         DATE OF BIRTH:  DATE OF PROCEDURE:  06/20/2017 DATE OF DISCHARGE:                              OPERATIVE REPORT   PREOPERATIVE DIAGNOSIS:  Spinal stenosis, spondylolisthesis, L4-5. Spinal stenosis, L3-4.  POSTOPERATIVE DIAGNOSIS:  Spinal stenosis, spondylolisthesis, L4-5. Spinal stenosis, L3-4.  PROCEDURE PERFORMED: 1. Microlumbar decompression at L4-5 and L3-4. 2. L4 laminectomy. 3. Bilateral foraminotomies, L4-L5. 4. Posterolateral fusion utilizing autologous and allograft bone     graft. 5. Allograft bone with AlloSync Pure.  ANESTHESIA:  General.  ASSISTANT:  Lacie Draft, PA.  HISTORY:  This is an 84 year old female with severe neurogenic claudication secondary to spinal stenosis, L4-5, multifactorial.  She had a degenerative listhesis at L4-5 due to facet hypertrophy.  No significant instability in flexion and extension, though grade 1 was noted.  We discussed microlumbar decompression at L4-5, laminectomy and decompression at the L3-4 where she had stenosis as well including lateral mass fusion with autologous and allograft bone graft.  We discussed postoperative bracing as well.  Risks and benefits were discussed including bleeding, infection, damage to the neurovascular structures, no change in symptoms, worsening symptoms, DVT, PE, anesthetic complications, need for revision in the future.  TECHNIQUE:  With the patient in supine position after induction of adequate general anesthesia, 2 g of Kefzol, placed prone on the [place] frame.  All bony prominences were well padded and lumbar region was prepped and draped in usual sterile fashion.  Two 18-gauge spinal needles were utilized to localize L4-5 interspace confirmed with x-ray. Incision was made from above the spinous process of  3 to below 5. Subcutaneous tissue was dissected.  Electrocautery was utilized to achieve hemostasis.  Marcaine 0.25% with epinephrine was infiltrated in the perimuscular tissue.  Dorsolumbar fascia was divided in line with skin incision.  Paraspinous muscle was elevated from lamina of 3-4 and 4- 5.  McCullough retractor was placed.  Operating microscope was draped and brought on the surgical field.  We skeletonized the spinous processes of 4 and partial of 5, removed the interspinous ligament, removed the spinous processes and morselized them per autologous bone. I then used the Leksell rongeur to perform partial laminectomy of 4, caudad edge.  We then used 2-mm Kerrison to continue the decompression at caudad of 4, continue the cephalad.  Hypertrophic ligamentum flavum was noted.  Used a curette to detach the ligamentum flavum from the cephalad edge of 5.  Then from both, we decompressed both lateral recesses to the medial border of the pedicle utilizing a Woodson retractor and a 2-mm Kerrison.  Severe stenosis was noted bilaterally, left greater than right.  This was from both sides of the operating room table.  Due to continued stenosis from the neural arch of 4, this was resected.  There was hypertrophic ligamentum flavum noted at 3-4 as well.  This was removed with 3-mm Kerrison centrally.  Foraminotomies were performed of L4 bilaterally.  No disk  herniation was noted.  Good restoration of thecal sac.  Hemilaminotomies of 5 bilaterally were performed and foraminotomies of L5 were performed as well.  Some stenosis was noted here as well.  Bone wax placed on the cancellous surfaces.  Neural patties protected the neural elements at all times.  Confirmatory radiograph was obtained. Good decompression of the thecal sac and nerve roots were noted.  I then went over the facets at L4-5 bilaterally and up to the pars.  Created a pocket laterally and curetted the bone, which was very soft  throughout the case.  She has osteoporosis.  We decorticated the lateral aspect of the pars, the facets and to the TPs.  The bone graft that was taken autologously was then mixed with AlloSync allograft bone.  This was deposited equal amounts bilaterally and impacted into the lateral recess without difficulty.  The neural elements were covered at all times following this.  We copiously irrigated the wound, any residual bone graft was then removed.  Good restoration of thecal sac.  No evidence of CSF leakage or active bleeding.  Thrombin-soaked Gelfoam was placed in laminotomy defect.  The dorsolumbar fascia was reapproximated with #1 Vicryl interrupted figure-of-eight sutures, subcu with 2-0 and skin with staples.  Wound was dressed sterilely, was closed with Prolene.  Sterile dressing applied.  Placed supine on the hospital bed, extubated without difficulty and transported to the recovery room in satisfactory condition.  The patient tolerated the procedure well.  No complications.  Assistant, Lacie Draft, [place].  Blood loss 200 cc.     Susa Day, M.D.     Geralynn Rile  D:  06/20/2017  T:  06/20/2017  Job:  [id number]

## 2017-06-21 NOTE — Progress Notes (Signed)
Occupational Therapy Treatment Patient Details Name: Paige Johnston MRN: [id number] DOB: 1933-10-07 Today's Date: 06/21/2017    History of present illness 84 y.o. female admitted on 06/20/17 for elective lumbar decompression and fusion L3-5.  Pt with significant PMH of HTN, breast CA, and DM.      Follow Up Recommendations  No OT follow up    Equipment Recommendations  None recommended by OT    Recommendations for Other Services      Precautions / Restrictions Precautions Precautions: Back Precaution Booklet Issued: Yes (comment) Precaution Comments: back education handout given, precautions reviewed with pt and her daughter.  Required Braces or Orthoses: Other Brace/Splint Other Brace/Splint: pt has brace       Mobility Bed Mobility               General bed mobility comments: pt in chair  Transfers Overall transfer level: Needs assistance Equipment used: Rolling walker (2 wheeled) Transfers: Sit to/from [facility] Sit to Stand: Supervision Stand pivot transfers: Supervision            Balance Overall balance assessment: Needs assistance Sitting-balance support: Feet supported;No upper extremity supported Sitting balance-Leahy Scale: Good     Standing balance support: Bilateral upper extremity supported;No upper extremity supported Standing balance-Leahy Scale: Good                             ADL either performed or assessed with clinical judgement   ADL Overall ADL's : Needs assistance/impaired Eating/Feeding: Set up;Sitting   Grooming: Set up;Sitting   Upper Body Bathing: Set up;Sitting   Lower Body Bathing: Minimal assistance;Sit to/from stand;Cueing for back precautions;Cueing for safety;Cueing for sequencing   Upper Body Dressing : Set up;Sitting;Minimal assistance Upper Body Dressing Details (indicate cue type and reason): min A with brace Lower Body Dressing: Minimal assistance;Sit to/from stand;Cueing for  safety;Cueing for sequencing;Cueing for compensatory techniques;With caregiver independent assisting   Toilet Transfer: Supervision/safety;Comfort height toilet   Toileting- Clothing Manipulation and Hygiene: Supervision/safety;Sit to/from stand;Cueing for safety;Cueing for sequencing;Adhering to back precautions Toileting - Clothing Manipulation Details (indicate cue type and reason): VC for back precautions with hygiene     Functional mobility during ADLs: Supervision/safety General ADL Comments: VC for back precautions in functional situations     Vision Baseline Vision/History: No visual deficits     Perception     Praxis      Cognition Arousal/Alertness: Awake/alert Behavior During Therapy: WFL for tasks assessed/performed Overall Cognitive Status: Within Functional Limits for tasks assessed                                                     Pertinent Vitals/ Pain       Faces Pain Scale: Hurts a little bit Pain Location: low back Pain Descriptors / Indicators: Aching;Burning Pain Intervention(s): Limited activity within patient's tolerance;Monitored during session  Home Living Family/patient expects to be discharged to:: Private residence Living Arrangements: Alone Available Help at Discharge: Family;Available 24 hours/day Type of Home: House Home Access: Level entry     Home Layout: One level     Bathroom Shower/Tub: Teacher, early years/pre: Standard     Home Equipment: Environmental consultant - 2 wheels;Cane - single point;Toilet riser          Prior Functioning/Environment  Level of Independence: Independent            Frequency  Min 2X/week        Progress Toward Goals  OT Goals(current goals can now be found in the care plan section)  Progress towards OT goals: Progressing toward goals  Acute Rehab OT Goals Patient Stated Goal: to get back to her normal routiene of taking care of herself.  OT Goal Formulation: With  patient Time For Goal Achievement: 06/29/17 Potential to Achieve Goals: Good  Plan      Co-evaluation                 AM-PAC PT "6 Clicks" Daily Activity     Outcome Measure   Help from another person eating meals?: None Help from another person taking care of personal grooming?: None Help from another person toileting, which includes using toliet, bedpan, or urinal?: A Little Help from another person bathing (including washing, rinsing, drying)?: A Little Help from another person to put on and taking off regular upper body clothing?: None Help from another person to put on and taking off regular lower body clothing?: A Little 6 Click Score: 21    End of Session Equipment Utilized During Treatment: Back brace  OT Visit Diagnosis: Unsteadiness on feet (R26.81)   Activity Tolerance Patient tolerated treatment well   Patient Left in chair;with call bell/phone within reach;with family/visitor present   Nurse Communication Mobility status        Time: 1610-9604 OT Time Calculation (min): 23 min  Charges: OT General Charges $OT Visit: 1 Visit OT Evaluation $OT Eval Moderate Complexity: 1 Mod OT Treatments $Self Care/Home Management : 8-22 mins  [place], [place] [phone]   Paige Johnston 06/21/2017, 12:33 PM

## 2017-06-21 NOTE — Evaluation (Signed)
Occupational Therapy Evaluation Patient Details Name: Paige Johnston MRN: [id number] DOB: [birth year] Today's Date: 06/21/2017    History of Present Illness 84 y.o. female admitted on 06/20/17 for elective lumbar decompression and fusion L3-5.  Pt with significant PMH of HTN, breast CA, and DM.   Clinical Impression   Patient is s/p back surgery resulting in the deficits listed below (see OT Problem List).  Patient will benefit from skilled OT to increase their safety and independence with ADL and functional mobility for ADL (while adhering to their precautions) to facilitate discharge to venue listed below.      Follow Up Recommendations  No OT follow up    Equipment Recommendations  None recommended by OT    Recommendations for Other Services       Precautions / Restrictions Precautions Precautions: Back Precaution Booklet Issued: Yes (comment) Precaution Comments: back education handout given, precautions reviewed with pt and her daughter.  Required Braces or Orthoses: Other Brace/Splint Other Brace/Splint: Chart says no brace, but pt reports she will be fitted for a brace.      Mobility Bed Mobility               General bed mobility comments: pt in chair  Transfers Overall transfer level: Needs assistance Equipment used: Rolling walker (2 wheeled) Transfers: Sit to/from [facility] Sit to Stand: Min guard Stand pivot transfers: Min guard            Balance Overall balance assessment: Needs assistance Sitting-balance support: Feet supported;No upper extremity supported Sitting balance-Leahy Scale: Good     Standing balance support: Bilateral upper extremity supported;No upper extremity supported Standing balance-Leahy Scale: Good                             ADL either performed or assessed with clinical judgement   ADL Overall ADL's : Needs assistance/impaired Eating/Feeding: Set up;Sitting   Grooming: Set up;Sitting    Upper Body Bathing: Set up;Sitting   Lower Body Bathing: Moderate assistance;Sit to/from stand;Cueing for back precautions;Cueing for safety;With adaptive equipment;Cueing for sequencing;Cueing for compensatory techniques   Upper Body Dressing : Set up;Sitting   Lower Body Dressing: Moderate assistance;Sit to/from stand;Cueing for safety;Cueing for sequencing;Cueing for compensatory techniques;Cueing for back precautions;Adhering to back precautions   Toilet Transfer: Min guard;Ambulation   Toileting- Clothing Manipulation and Hygiene: Minimal assistance;Sit to/from stand;Cueing for safety;Cueing for sequencing       Functional mobility during ADLs: Min guard       Vision Baseline Vision/History: No visual deficits       Perception     Praxis      Pertinent Vitals/Pain Faces Pain Scale: Hurts little more Pain Location: low back Pain Descriptors / Indicators: Aching;Burning Pain Intervention(s): Limited activity within patient's tolerance;Repositioned;Monitored during session     Hand Dominance     Extremity/Trunk Assessment Upper Extremity Assessment Upper Extremity Assessment: Overall WFL for tasks assessed           Communication Communication Communication: No difficulties   Cognition Arousal/Alertness: Awake/alert Behavior During Therapy: WFL for tasks assessed/performed Overall Cognitive Status: Within Functional Limits for tasks assessed                                                Home Living Family/patient expects to be discharged to::  Private residence Living Arrangements: Alone Available Help at Discharge: Family;Available 24 hours/day Type of Home: House Home Access: Level entry     Home Layout: One level     Bathroom Shower/Tub: Teacher, early years/pre: Standard     Home Equipment: Environmental consultant - 2 wheels;Cane - single point;Toilet riser          Prior Functioning/Environment Level of Independence:  Independent                 OT Problem List: Decreased strength;Decreased activity tolerance;Decreased knowledge of use of DME or AE;Decreased safety awareness;Decreased knowledge of precautions      OT Treatment/Interventions: Self-care/ADL training;Patient/family education    OT Goals(Current goals can be found in the care plan section) Acute Rehab OT Goals Patient Stated Goal: to get back to her normal routiene of taking care of herself.  OT Goal Formulation: With patient Time For Goal Achievement: 06/29/17 Potential to Achieve Goals: Good  OT Frequency: Min 2X/week   Barriers to D/C:            Co-evaluation              AM-PAC PT "6 Clicks" Daily Activity     Outcome Measure Help from another person eating meals?: None Help from another person taking care of personal grooming?: A Little Help from another person toileting, which includes using toliet, bedpan, or urinal?: A Little Help from another person bathing (including washing, rinsing, drying)?: A Little Help from another person to put on and taking off regular upper body clothing?: A Little Help from another person to put on and taking off regular lower body clothing?: A Little 6 Click Score: 19   End of Session Equipment Utilized During Treatment: Back brace Nurse Communication: Mobility status  Activity Tolerance: Patient tolerated treatment well Patient left: in chair;with call bell/phone within reach;with family/visitor present  OT Visit Diagnosis: Unsteadiness on feet (R26.81)                Time: 0920-1000 OT Time Calculation (min): 40 min Charges:  OT General Charges $OT Visit: 1 Visit OT Evaluation $OT Eval Moderate Complexity: 1 Mod OT Treatments $Self Care/Home Management : 8-22 mins G-Codes:     Kari Baars, OT [phone]  Payton Mccallum D 06/21/2017, 11:07 AM

## 2017-06-21 NOTE — Progress Notes (Signed)
Physical Therapy Treatment Patient Details Name: Paige Johnston MRN: [id number] DOB: 08-Nov-1933 Today's Date: 06/21/2017    History of Present Illness 84 y.o. female admitted on 06/20/17 for elective lumbar decompression and fusion L3-5.  Pt with significant PMH of HTN, breast CA, and DM.    PT Comments    Pt educated on donning/doffing back brace.  Daughter present and also observed all mobility and education.  Pt performed log roll technique a few times to perform correctly.  Pt tolerated good distance of ambulation.  Pt feels ready for d/c home today.  Follow Up Recommendations  Follow surgeon's recommendation for DC plan and follow-up therapies;Supervision for mobility/OOB     Equipment Recommendations  None recommended by PT    Recommendations for Other Services       Precautions / Restrictions Precautions Precautions: Back Precaution Booklet Issued: Yes (comment) Precaution Comments: reviewed back precautions and log roll technique with pt and daughter Required Braces or Orthoses: Other Brace/Splint Other Brace/Splint: LSO - reinforced donning and doffing brace    Mobility  Bed Mobility Overal bed mobility: Needs Assistance Bed Mobility: Rolling;Sidelying to Sit;Sit to Sidelying Rolling: Min guard Sidelying to sit: Min guard     Sit to sidelying: Min guard General bed mobility comments: max verbal cues for log roll technique, performed x3 for correct technique and performance, daughter present and also educated  Transfers Overall transfer level: Needs assistance Equipment used: Rolling walker (2 wheeled) Transfers: Sit to/from Stand Sit to Stand: Supervision Stand pivot transfers: Supervision       General transfer comment: pt utilized armrests, cues for maintaining back precautions  Ambulation/Gait Ambulation/Gait assistance: Supervision Ambulation Distance (Feet): 300 Feet Assistive device: None Gait Pattern/deviations: Step-through pattern;Decreased  stride length     General Gait Details: good upright posture, slow but steady gait, daughter reports improved in ambulation post op    Stairs            Wheelchair Mobility    Modified Rankin (Stroke Patients Only)       Balance Overall balance assessment: Needs assistance Sitting-balance support: Feet supported;No upper extremity supported Sitting balance-Leahy Scale: Good     Standing balance support: Bilateral upper extremity supported;No upper extremity supported Standing balance-Leahy Scale: Good                              Cognition Arousal/Alertness: Awake/alert Behavior During Therapy: WFL for tasks assessed/performed Overall Cognitive Status: Within Functional Limits for tasks assessed                                        Exercises      General Comments        Pertinent Vitals/Pain Pain Assessment: Faces Faces Pain Scale: Hurts a little bit Pain Location: low back Pain Descriptors / Indicators: Aching;Burning Pain Intervention(s): Limited activity within patient's tolerance;Monitored during session;Repositioned    Home Living Family/patient expects to be discharged to:: Private residence Living Arrangements: Alone Available Help at Discharge: Family;Available 24 hours/day Type of Home: House Home Access: Level entry   Home Layout: One level Home Equipment: Walker - 2 wheels;Cane - single point;Toilet riser      Prior Function Level of Independence: Independent          PT Goals (current goals can now be found in the care plan section) Acute Rehab PT  Goals Patient Stated Goal: to get back to her normal routiene of taking care of herself.  Progress towards PT goals: Progressing toward goals    Frequency    Min 5X/week      PT Plan Current plan remains appropriate    Co-evaluation              AM-PAC PT "6 Clicks" Daily Activity  Outcome Measure  Difficulty turning over in bed (including  adjusting bedclothes, sheets and blankets)?: A Lot Difficulty moving from lying on back to sitting on the side of the bed? : A Lot Difficulty sitting down on and standing up from a chair with arms (e.g., wheelchair, bedside commode, etc,.)?: A Lot Help needed moving to and from a bed to chair (including a wheelchair)?: A Little Help needed walking in hospital room?: A Little Help needed climbing 3-5 steps with a railing? : A Little 6 Click Score: 15    End of Session   Activity Tolerance: Patient tolerated treatment well Patient left: in chair;with call bell/phone within reach;with family/visitor present Nurse Communication: Mobility status PT Visit Diagnosis: Difficulty in walking, not elsewhere classified (R26.2)     Time: 1000-1023 PT Time Calculation (min) (ACUTE ONLY): 23 min  Charges:  $Gait Training: 8-22 mins $Therapeutic Activity: 8-22 mins                    G Codes:       Carmelia Bake, PT, DPT 06/21/2017 Pager: [phone]  York Ram E 06/21/2017, 1:18 PM

## 2017-06-21 NOTE — Progress Notes (Signed)
Discharge planning, no HH needs identified.Has DME. [phone]

## 2017-07-26 DIAGNOSIS — M1711 Unilateral primary osteoarthritis, right knee: Secondary | ICD-10-CM | POA: Diagnosis not present

## 2017-07-26 DIAGNOSIS — M1712 Unilateral primary osteoarthritis, left knee: Secondary | ICD-10-CM | POA: Diagnosis not present

## 2017-08-01 DIAGNOSIS — Z4889 Encounter for other specified surgical aftercare: Secondary | ICD-10-CM | POA: Diagnosis not present

## 2017-08-01 DIAGNOSIS — M1712 Unilateral primary osteoarthritis, left knee: Secondary | ICD-10-CM | POA: Diagnosis not present

## 2017-08-01 DIAGNOSIS — Z4789 Encounter for other orthopedic aftercare: Secondary | ICD-10-CM | POA: Diagnosis not present

## 2017-08-01 DIAGNOSIS — M4310 Spondylolisthesis, site unspecified: Secondary | ICD-10-CM | POA: Diagnosis not present

## 2017-08-01 DIAGNOSIS — M5136 Other intervertebral disc degeneration, lumbar region: Secondary | ICD-10-CM | POA: Diagnosis not present

## 2017-08-01 DIAGNOSIS — M1711 Unilateral primary osteoarthritis, right knee: Secondary | ICD-10-CM | POA: Diagnosis not present

## 2017-08-05 DIAGNOSIS — I10 Essential (primary) hypertension: Secondary | ICD-10-CM | POA: Diagnosis not present

## 2017-08-05 DIAGNOSIS — Z79899 Other long term (current) drug therapy: Secondary | ICD-10-CM | POA: Diagnosis not present

## 2017-08-05 DIAGNOSIS — E119 Type 2 diabetes mellitus without complications: Secondary | ICD-10-CM | POA: Diagnosis not present

## 2017-08-05 DIAGNOSIS — Z0000 Encounter for general adult medical examination without abnormal findings: Secondary | ICD-10-CM | POA: Diagnosis not present

## 2017-08-08 DIAGNOSIS — M1712 Unilateral primary osteoarthritis, left knee: Secondary | ICD-10-CM | POA: Diagnosis not present

## 2017-08-08 DIAGNOSIS — M1711 Unilateral primary osteoarthritis, right knee: Secondary | ICD-10-CM | POA: Diagnosis not present

## 2017-08-08 DIAGNOSIS — M170 Bilateral primary osteoarthritis of knee: Secondary | ICD-10-CM | POA: Diagnosis not present

## 2017-09-04 DIAGNOSIS — H526 Other disorders of refraction: Secondary | ICD-10-CM | POA: Diagnosis not present

## 2017-09-12 DIAGNOSIS — Z4789 Encounter for other orthopedic aftercare: Secondary | ICD-10-CM | POA: Diagnosis not present

## 2017-09-26 DIAGNOSIS — E119 Type 2 diabetes mellitus without complications: Secondary | ICD-10-CM | POA: Diagnosis not present

## 2017-12-23 DIAGNOSIS — E119 Type 2 diabetes mellitus without complications: Secondary | ICD-10-CM | POA: Diagnosis not present

## 2017-12-27 DIAGNOSIS — E1169 Type 2 diabetes mellitus with other specified complication: Secondary | ICD-10-CM | POA: Diagnosis not present

## 2017-12-27 DIAGNOSIS — I10 Essential (primary) hypertension: Secondary | ICD-10-CM | POA: Diagnosis not present

## 2018-01-08 DIAGNOSIS — I10 Essential (primary) hypertension: Secondary | ICD-10-CM | POA: Diagnosis not present

## 2018-01-17 DIAGNOSIS — Z23 Encounter for immunization: Secondary | ICD-10-CM | POA: Diagnosis not present

## 2018-02-05 DIAGNOSIS — E7800 Pure hypercholesterolemia, unspecified: Secondary | ICD-10-CM | POA: Diagnosis not present

## 2018-02-05 DIAGNOSIS — E1142 Type 2 diabetes mellitus with diabetic polyneuropathy: Secondary | ICD-10-CM | POA: Diagnosis not present

## 2018-02-05 DIAGNOSIS — I10 Essential (primary) hypertension: Secondary | ICD-10-CM | POA: Diagnosis not present

## 2018-02-05 DIAGNOSIS — E1169 Type 2 diabetes mellitus with other specified complication: Secondary | ICD-10-CM | POA: Diagnosis not present

## 2018-03-03 DIAGNOSIS — M5136 Other intervertebral disc degeneration, lumbar region: Secondary | ICD-10-CM | POA: Diagnosis not present

## 2018-03-03 DIAGNOSIS — M4310 Spondylolisthesis, site unspecified: Secondary | ICD-10-CM | POA: Diagnosis not present

## 2018-03-03 DIAGNOSIS — M48061 Spinal stenosis, lumbar region without neurogenic claudication: Secondary | ICD-10-CM | POA: Diagnosis not present

## 2018-03-03 DIAGNOSIS — M419 Scoliosis, unspecified: Secondary | ICD-10-CM | POA: Diagnosis not present

## 2018-03-04 ENCOUNTER — Other Ambulatory Visit: Payer: Self-pay | Admitting: Geriatric Medicine

## 2018-03-04 DIAGNOSIS — Z1231 Encounter for screening mammogram for malignant neoplasm of breast: Secondary | ICD-10-CM

## 2018-03-23 DIAGNOSIS — E119 Type 2 diabetes mellitus without complications: Secondary | ICD-10-CM | POA: Diagnosis not present

## 2018-04-15 ENCOUNTER — Ambulatory Visit
Admission: RE | Admit: 2018-04-15 | Discharge: 2018-04-15 | Disposition: A | Discharge status: Home / Self Care | Payer: Medicare Other | Source: Ambulatory Visit | Attending: Geriatric Medicine | Admitting: Geriatric Medicine

## 2018-04-15 DIAGNOSIS — Z1231 Encounter for screening mammogram for malignant neoplasm of breast: Secondary | ICD-10-CM | POA: Diagnosis not present

## 2018-06-20 DIAGNOSIS — E119 Type 2 diabetes mellitus without complications: Secondary | ICD-10-CM | POA: Diagnosis not present

## 2018-08-11 DIAGNOSIS — Z0000 Encounter for general adult medical examination without abnormal findings: Secondary | ICD-10-CM | POA: Diagnosis not present

## 2018-08-11 DIAGNOSIS — F325 Major depressive disorder, single episode, in full remission: Secondary | ICD-10-CM | POA: Diagnosis not present

## 2018-08-11 DIAGNOSIS — I10 Essential (primary) hypertension: Secondary | ICD-10-CM | POA: Diagnosis not present

## 2018-08-11 DIAGNOSIS — K219 Gastro-esophageal reflux disease without esophagitis: Secondary | ICD-10-CM | POA: Diagnosis not present

## 2018-09-19 DIAGNOSIS — E119 Type 2 diabetes mellitus without complications: Secondary | ICD-10-CM | POA: Diagnosis not present

## 2018-10-15 ENCOUNTER — Emergency Department (HOSPITAL_COMMUNITY): Payer: Medicare Other

## 2018-10-15 ENCOUNTER — Other Ambulatory Visit: Payer: Self-pay

## 2018-10-15 ENCOUNTER — Emergency Department (HOSPITAL_COMMUNITY)
Admission: EM | Admit: 2018-10-15 | Discharge: 2018-10-15 | Disposition: A | Discharge status: Home / Self Care | Payer: Medicare Other | Attending: Emergency Medicine | Admitting: Emergency Medicine

## 2018-10-15 ENCOUNTER — Encounter (HOSPITAL_COMMUNITY): Payer: Self-pay | Admitting: Emergency Medicine

## 2018-10-15 DIAGNOSIS — S61412A Laceration without foreign body of left hand, initial encounter: Secondary | ICD-10-CM | POA: Diagnosis not present

## 2018-10-15 DIAGNOSIS — Y9389 Activity, other specified: Secondary | ICD-10-CM | POA: Diagnosis not present

## 2018-10-15 DIAGNOSIS — Z853 Personal history of malignant neoplasm of breast: Secondary | ICD-10-CM | POA: Diagnosis not present

## 2018-10-15 DIAGNOSIS — I10 Essential (primary) hypertension: Secondary | ICD-10-CM | POA: Insufficient documentation

## 2018-10-15 DIAGNOSIS — Z79899 Other long term (current) drug therapy: Secondary | ICD-10-CM | POA: Insufficient documentation

## 2018-10-15 DIAGNOSIS — Z23 Encounter for immunization: Secondary | ICD-10-CM | POA: Diagnosis not present

## 2018-10-15 DIAGNOSIS — Z7982 Long term (current) use of aspirin: Secondary | ICD-10-CM | POA: Insufficient documentation

## 2018-10-15 DIAGNOSIS — E119 Type 2 diabetes mellitus without complications: Secondary | ICD-10-CM | POA: Diagnosis not present

## 2018-10-15 DIAGNOSIS — W010XXA Fall on same level from slipping, tripping and stumbling without subsequent striking against object, initial encounter: Secondary | ICD-10-CM | POA: Diagnosis not present

## 2018-10-15 DIAGNOSIS — S59912A Unspecified injury of left forearm, initial encounter: Secondary | ICD-10-CM | POA: Diagnosis not present

## 2018-10-15 DIAGNOSIS — W19XXXA Unspecified fall, initial encounter: Secondary | ICD-10-CM

## 2018-10-15 DIAGNOSIS — Y999 Unspecified external cause status: Secondary | ICD-10-CM | POA: Insufficient documentation

## 2018-10-15 DIAGNOSIS — S6992XA Unspecified injury of left wrist, hand and finger(s), initial encounter: Secondary | ICD-10-CM | POA: Diagnosis not present

## 2018-10-15 DIAGNOSIS — S01511A Laceration without foreign body of lip, initial encounter: Secondary | ICD-10-CM | POA: Insufficient documentation

## 2018-10-15 DIAGNOSIS — Y92007 Garden or yard of unspecified non-institutional (private) residence as the place of occurrence of the external cause: Secondary | ICD-10-CM | POA: Insufficient documentation

## 2018-10-15 DIAGNOSIS — S0181XA Laceration without foreign body of other part of head, initial encounter: Secondary | ICD-10-CM

## 2018-10-15 MED ORDER — LIDOCAINE HCL (PF) 1 % IJ SOLN
5.0000 mL | Freq: Once | INTRAMUSCULAR | Status: AC
  Administered 2018-10-15: 5 mL
  Filled 2018-10-15: qty 30

## 2018-10-15 MED ORDER — TETANUS-DIPHTH-ACELL PERTUSSIS 5-2.5-18.5 LF-MCG/0.5 IM SUSP
0.5000 mL | Freq: Once | INTRAMUSCULAR | Status: AC
  Administered 2018-10-15: 0.5 mL via INTRAMUSCULAR
  Filled 2018-10-15: qty 0.5

## 2018-10-15 NOTE — Discharge Instructions (Addendum)
The hand stitches should come out in around 10 to 14 days.  The face stitches should come out in around 5 to 7 days.

## 2018-10-15 NOTE — ED Provider Notes (Signed)
[facility] DEPT Provider Note   CSN: [id number] Arrival date & time: 10/15/18  1608     History   Chief Complaint Chief Complaint  Patient presents with  . Fall    HPI Paige Johnston is a 85 y.o. female.     HPI Patient presents after a fall.  States she was taking out the trash and lost her balance, falling forward.  Hit her face and left hand.  Also abrasion to left knee.  No loss conscious.  No neck pain.  States there was bleeding into her driveway.  She is not on anticoagulation.  No numbness weakness.  No confusion.  Does not know when her last tetanus shot was.  States she is a little nervous about being here. Past Medical History:  Diagnosis Date  . Cancer ([place])    breast  . Diabetes mellitus    TYPE 2  . GERD (gastroesophageal reflux disease)   . History of breast cancer   . hx: breast cancer, left UOQ, invasive ductal carcinoma, receptor + her 2 - 01/06/2007  . Hyperlipidemia   . Hypertension   . Neuromuscular disorder ([facility])    sciatica  . Psoriasis   . Restless leg   . Sleep apnea    MILD CASE; NO CPAP USE     Patient Active Problem List   Diagnosis Date Noted  . Lumbar spinal stenosis 06/21/2017  . Spinal stenosis of lumbar region 06/20/2017  . Spinal stenosis at L4-L5 level 06/20/2017  . Hx breast cancer, IDC, Left UOQ, Stege I, receptor + 01/06/2007    Past Surgical History:  Procedure Laterality Date  . APPENDECTOMY  1986   TAH   . BREAST EXCISIONAL BIOPSY Left 2008   benign  . BREAST LUMPECTOMY W/ NEEDLE LOCALIZATION  02/04/2007   left breast cancer  . EYE SURGERY Bilateral 2016   CATARACT EXTRACTION    . LUMBAR LAMINECTOMY/DECOMPRESSION MICRODISCECTOMY N/A 06/20/2017   Procedure: Microlumbar decompression L4-5, L3-4, lateral mass fusion L4-5 with allograft and autograft bone;  Surgeon: Susa Day, MD;  Location: [facility];  Service: Orthopedics;  Laterality: N/A;  120 mins  . TUBAL LIGATION  1970s     OB  History   No obstetric history on file.      Home Medications    Prior to Admission medications   Medication Sig Start Date End Date Taking? Authorizing Provider  aspirin EC 81 MG tablet Take 1-2 tablets (81-162 mg total) by mouth daily. Resume 4 days post-op Patient taking differently: Take 81 mg by mouth daily.  06/20/17  Yes Lacie Draft M, PA-C  cholecalciferol (VITAMIN D) 1000 units tablet Take 1,000 Units by mouth daily.   Yes [provider]  Cyanocobalamin (B-12) 2500 MCG TABS Take 2,500 mcg by mouth daily.   Yes [provider]  docusate sodium (COLACE) 100 MG capsule Take 100 mg by mouth daily.   Yes [provider]  Ferrous Sulfate (IRON PO) Take 1 tablet by mouth daily.   Yes [provider]  glimepiride (AMARYL) 2 MG tablet Take 3 mg by mouth daily with breakfast.  07/11/18  Yes [provider]  hydrochlorothiazide (HYDRODIURIL) 25 MG tablet Take 25 mg by mouth daily with supper.   Yes [provider]  losartan (COZAAR) 100 MG tablet Take 100 mg by mouth daily.   Yes [provider]  Magnesium 250 MG TABS Take 250 mg by mouth daily.   Yes [provider]  metFORMIN (GLUCOPHAGE) 1000 MG tablet Take 1,000 mg by mouth 2 (two) times daily with a meal.   Yes [provider]  metoprolol succinate (TOPROL-XL) 100 MG 24 hr tablet Take 100 mg by mouth daily with supper. Take with or immediately following a meal.   Yes [provider]  Multiple Vitamin (MULTIVITAMIN) tablet Take 1 tablet by mouth daily.     Yes [provider]  Multiple Vitamins-Minerals (ZINC PO) Take 1 tablet by mouth daily.   Yes [provider]  Omega-3 Fatty Acids (FISH OIL) 1000 MG CAPS Take 1 capsule by mouth daily.   Yes [provider]  omeprazole (PRILOSEC) 40 MG capsule Take 40 mg by mouth daily with supper.  03/26/11  Yes [provider]  simvastatin (ZOCOR) 40 MG tablet Take 40 mg by  mouth daily.  10/06/18  Yes [provider]  venlafaxine (EFFEXOR) 75 MG tablet Take 75 mg by mouth daily. 07/11/18  Yes [provider]  Carboxymethylcellul-Glycerin (LUBRICATING EYE DROPS OP) Place 1 drop into both eyes daily as needed (dry eyes).    [provider]  diphenhydramine-acetaminophen (TYLENOL PM) 25-500 MG TABS tablet Take 2 tablets by mouth at bedtime as needed (sleep).     [provider]  glucose blood (FREESTYLE LITE) test strip 1 each by Other route as needed. Use as instructed     [provider]  HYDROcodone-acetaminophen (NORCO/VICODIN) 5-325 MG tablet Take 1-2 tablets by mouth daily as needed for moderate pain. 06/20/17   Cecilie Kicks, PA-C  polyethylene glycol [facility]) packet Take 17 g by mouth daily. Patient not taking: Reported on 10/15/2018 06/20/17   Cecilie Kicks, PA-C  tamoxifen (NOLVADEX) 20 MG tablet Take 1 tablet (20 mg total) by mouth daily. Patient not taking: Reported on 06/10/2017 08/27/11   Magrinat, Virgie Dad, MD    Family History Family History  Problem Relation Age of Onset  . Stroke Mother   . Heart disease Father     Social History Social History   Tobacco Use  . Smoking status: Never Smoker  . Smokeless tobacco: Never Used  Substance Use Topics  . Alcohol use: Yes    Comment: very seldom  . Drug use: No     Allergies   Patient has no known allergies.   Review of Systems Review of Systems  Constitutional: Negative for appetite change.  HENT: Negative for congestion.   Respiratory: Negative for shortness of breath.   Cardiovascular: Negative for chest pain.  Gastrointestinal: Negative for abdominal distention.  Musculoskeletal: Negative for back pain and neck pain.  Skin: Positive for wound.  Neurological: Negative for weakness and headaches.  Psychiatric/Behavioral: The patient is nervous/anxious.      Physical Exam Updated Vital Signs BP (!) 180/97   Pulse (!) 115   Temp  98.5 F (36.9 C) (Oral)   Resp 18   SpO2 96%   Physical Exam Vitals signs and nursing note reviewed.  HENT:     Head:     Comments: Mild abrasion over chin.  Approximately 1.5 cm laceration to upper lip.  There is also laceration on the mucosal side but does not appear to communicate together.  No dental tenderness or deformity. Cardiovascular:     Rate and Rhythm: Tachycardia present.  Pulmonary:     Effort: No respiratory distress.  Abdominal:     Tenderness: There is no abdominal tenderness.  Musculoskeletal:     Left lower leg: No edema.  Comments: Left hand over the thenar area has a 1.5 cm laceration.  There is exposed fat.  Neurovascular intact distally.  No tenderness over wrist but there is some bruising around the wound.  No cervical spine tenderness.  No other extremity tenderness and good range of motion.  There is a abrasion over the medial aspect of the hand however no frank laceration.  Skin:    General: Skin is warm.     Capillary Refill: Capillary refill takes less than 2 seconds.  Neurological:     Mental Status: She is alert.      ED Treatments / Results  Labs (all labs ordered are listed, but only abnormal results are displayed) Labs Reviewed - No data to display  EKG EKG Interpretation  Date/Time:  Wednesday October 15 2018 19:22:23 EDT Ventricular Rate:  122 PR Interval:    QRS Duration: 78 QT Interval:  336 QTC Calculation: 479 R Axis:   -67 Text Interpretation:  Sinus tachycardia Left anterior fascicular block Abnormal R-wave progression, late transition Borderline T abnormalities, anterior leads Confirmed by Davonna Belling [id number]) on 10/15/2018 7:35:32 PM   Radiology Dg Forearm Left  Result Date: 10/15/2018 CLINICAL DATA:  Fall EXAM: LEFT FOREARM - 2 VIEW COMPARISON:  None. FINDINGS: There is no evidence of fracture or other focal bone lesions. Soft tissues are unremarkable. IMPRESSION: Negative. Electronically Signed   By: Ulyses Jarred  M.D.   On: 10/15/2018 18:27   Dg Wrist Complete Left  Result Date: 10/15/2018 CLINICAL DATA:  Fall EXAM: LEFT WRIST - COMPLETE 3+ VIEW COMPARISON:  None. FINDINGS: There is no evidence of fracture or dislocation. There is no evidence of arthropathy or other focal bone abnormality. Soft tissues are unremarkable. IMPRESSION: Negative. Electronically Signed   By: Ulyses Jarred M.D.   On: 10/15/2018 18:32   Dg Hand Complete Left  Result Date: 10/15/2018 CLINICAL DATA:  Fall EXAM: LEFT HAND - COMPLETE 3+ VIEW COMPARISON:  None. FINDINGS: The bones are osteopenic. There is moderate osteoarthrosis at the first carpometacarpal joint. There is mild irregularity at the proximal aspect of the distal phalanx of the thumb. Otherwise, no fracture or dislocation. IMPRESSION: 1. Mild irregularity the distal phalanx of the thumb. If there is tenderness at this location, this may indicate a minimally displaced fracture. 2. Osteopenia and first carpometacarpal joint osteoarthrosis Electronically Signed   By: Ulyses Jarred M.D.   On: 10/15/2018 18:26    Procedures .[name] KitchenLaceration Repair  Date/Time: 10/15/2018 8:46 PM Performed by: Davonna Belling, MD Authorized by: Davonna Belling, MD   Consent:    Consent obtained:  Verbal   Consent given by:  Patient   Risks discussed:  Infection, need for additional repair, poor cosmetic result, retained foreign body, pain and poor wound healing   Alternatives discussed:  No treatment and delayed treatment Anesthesia (see MAR for exact dosages):    Anesthesia method:  Local infiltration   Local anesthetic:  Lidocaine 1% w/o epi Laceration details:    Location:  Lip   Lip location:  Upper exterior lip   Length (cm):  1.5 Repair type:    Repair type:  Simple Pre-procedure details:    Preparation:  Patient was prepped and draped in usual sterile fashion Exploration:    Hemostasis achieved with:  Direct pressure   Contaminated: no   Treatment:    Area cleansed with:   Saline   Amount of cleaning:  Standard Skin repair:    Repair method:  Sutures   Suture size:  5-0   Wound skin closure material used: vicryl rapide.   Suture technique:  Simple interrupted   Number of sutures:  4 Approximation:    Approximation:  Close Post-procedure details:    Dressing:  Open (no dressing)   Patient tolerance of procedure:  Tolerated well, no immediate complications .[name] KitchenLaceration Repair  Date/Time: 10/15/2018 8:47 PM Performed by: Davonna Belling, MD Authorized by: Davonna Belling, MD   Consent:    Consent obtained:  Verbal   Consent given by:  Patient   Risks discussed:  Infection, need for additional repair, nerve damage, vascular damage, tendon damage, retained foreign body, pain, poor cosmetic result and poor wound healing   Alternatives discussed:  No treatment, delayed treatment and observation Anesthesia (see MAR for exact dosages):    Anesthesia method:  Local infiltration   Local anesthetic:  Lidocaine 1% w/o epi Laceration details:    Location:  Hand   Hand location:  L palm   Length (cm):  1.5 Repair type:    Repair type:  Simple Pre-procedure details:    Preparation:  Patient was prepped and draped in usual sterile fashion and imaging obtained to evaluate for foreign bodies Exploration:    Hemostasis achieved with:  Direct pressure   Wound exploration: wound explored through full range of motion     Wound extent: no foreign bodies/material noted     Contaminated: no   Treatment:    Area cleansed with:  Saline   Amount of cleaning:  Standard Skin repair:    Repair method:  Sutures   Suture size:  4-0   Suture material:  Prolene   Suture technique:  Simple interrupted   Number of sutures:  5 Approximation:    Approximation:  Close Post-procedure details:    Dressing:  Sterile dressing   Patient tolerance of procedure:  Tolerated well, no immediate complications   (including critical care time)  Medications Ordered in ED  Medications  Tdap (BOOSTRIX) injection 0.5 mL (0.5 mLs Intramuscular Given 10/15/18 1858)  lidocaine (PF) (XYLOCAINE) 1 % injection 5 mL (5 mLs Infiltration Given by Other 10/15/18 1905)     Initial Impression / Assessment and Plan / ED Course  I have reviewed the triage vital signs and the nursing notes.  Pertinent labs & imaging results that were available during my care of the patient were reviewed by me and considered in my medical decision making (see chart for details).        Patient with fall.  Laceration to face and hand closed.  Tetanus updated.  Imaging reassuring.  Initial tachycardia is improved somewhat.  During suturing would actually go down below 100 but states he is somewhat nervous.  I think this is less likely from blood loss more likely from the anxiety while being in the ER.  Not lightheadedness dizziness and has maintain blood pressure.  Discharge home with outpatient follow-up for suture removal.  Final Clinical Impressions(s) / ED Diagnoses   Final diagnoses:  Fall, initial encounter  Facial laceration, initial encounter  Laceration of left hand, foreign body presence unspecified, initial encounter    ED Discharge Orders    None       Davonna Belling, MD 10/15/18 2048

## 2018-10-15 NOTE — ED Notes (Signed)
Wound cleaned and dressed. Reviewed suture care and discharge instructions with pt. Pt verbalizes understanding and has no questions at this time.

## 2018-10-15 NOTE — ED Triage Notes (Signed)
Patient reports fall taking trash out today. C/o left arm pain with laceration on left palm and laceration on left upper lip. Bleeding controlled. Denies head injury and LOC. Denies taking blood thinners.

## 2018-10-30 DIAGNOSIS — Z4802 Encounter for removal of sutures: Secondary | ICD-10-CM | POA: Diagnosis not present

## 2018-11-17 DIAGNOSIS — E1151 Type 2 diabetes mellitus with diabetic peripheral angiopathy without gangrene: Secondary | ICD-10-CM | POA: Diagnosis not present

## 2018-11-17 DIAGNOSIS — E1169 Type 2 diabetes mellitus with other specified complication: Secondary | ICD-10-CM | POA: Diagnosis not present

## 2018-11-17 DIAGNOSIS — I10 Essential (primary) hypertension: Secondary | ICD-10-CM | POA: Diagnosis not present

## 2018-11-17 DIAGNOSIS — E1142 Type 2 diabetes mellitus with diabetic polyneuropathy: Secondary | ICD-10-CM | POA: Diagnosis not present

## 2019-02-16 DIAGNOSIS — F325 Major depressive disorder, single episode, in full remission: Secondary | ICD-10-CM | POA: Diagnosis not present

## 2019-02-16 DIAGNOSIS — E7800 Pure hypercholesterolemia, unspecified: Secondary | ICD-10-CM | POA: Diagnosis not present

## 2019-02-16 DIAGNOSIS — K219 Gastro-esophageal reflux disease without esophagitis: Secondary | ICD-10-CM | POA: Diagnosis not present

## 2019-02-16 DIAGNOSIS — I10 Essential (primary) hypertension: Secondary | ICD-10-CM | POA: Diagnosis not present

## 2019-02-17 ENCOUNTER — Other Ambulatory Visit: Payer: Self-pay | Admitting: Dermatology

## 2019-02-17 DIAGNOSIS — L820 Inflamed seborrheic keratosis: Secondary | ICD-10-CM | POA: Diagnosis not present

## 2019-02-17 DIAGNOSIS — C44319 Basal cell carcinoma of skin of other parts of face: Secondary | ICD-10-CM | POA: Diagnosis not present

## 2019-02-17 DIAGNOSIS — D485 Neoplasm of uncertain behavior of skin: Secondary | ICD-10-CM | POA: Diagnosis not present

## 2019-02-24 DIAGNOSIS — K219 Gastro-esophageal reflux disease without esophagitis: Secondary | ICD-10-CM | POA: Diagnosis not present

## 2019-03-30 ENCOUNTER — Other Ambulatory Visit: Payer: Self-pay | Admitting: Geriatric Medicine

## 2019-03-30 DIAGNOSIS — Z1231 Encounter for screening mammogram for malignant neoplasm of breast: Secondary | ICD-10-CM

## 2019-04-14 DIAGNOSIS — C44319 Basal cell carcinoma of skin of other parts of face: Secondary | ICD-10-CM | POA: Diagnosis not present

## 2019-05-20 ENCOUNTER — Other Ambulatory Visit: Payer: Self-pay

## 2019-05-20 ENCOUNTER — Ambulatory Visit
Admission: RE | Admit: 2019-05-20 | Discharge: 2019-05-20 | Disposition: A | Discharge status: Home / Self Care | Payer: Medicare HMO | Source: Ambulatory Visit | Attending: Geriatric Medicine | Admitting: Geriatric Medicine

## 2019-05-20 DIAGNOSIS — Z1231 Encounter for screening mammogram for malignant neoplasm of breast: Secondary | ICD-10-CM

## 2019-06-22 DIAGNOSIS — Z79899 Other long term (current) drug therapy: Secondary | ICD-10-CM | POA: Diagnosis not present

## 2019-06-22 DIAGNOSIS — E1142 Type 2 diabetes mellitus with diabetic polyneuropathy: Secondary | ICD-10-CM | POA: Diagnosis not present

## 2019-06-22 DIAGNOSIS — E1151 Type 2 diabetes mellitus with diabetic peripheral angiopathy without gangrene: Secondary | ICD-10-CM | POA: Diagnosis not present

## 2019-06-22 DIAGNOSIS — E1169 Type 2 diabetes mellitus with other specified complication: Secondary | ICD-10-CM | POA: Diagnosis not present

## 2019-06-22 DIAGNOSIS — I10 Essential (primary) hypertension: Secondary | ICD-10-CM | POA: Diagnosis not present

## 2019-06-22 DIAGNOSIS — Z7984 Long term (current) use of oral hypoglycemic drugs: Secondary | ICD-10-CM | POA: Diagnosis not present

## 2019-06-28 ENCOUNTER — Ambulatory Visit: Payer: Medicare HMO | Attending: Internal Medicine

## 2019-06-28 DIAGNOSIS — Z23 Encounter for immunization: Secondary | ICD-10-CM | POA: Insufficient documentation

## 2019-06-28 NOTE — Progress Notes (Signed)
   Covid-19 Vaccination Clinic  Name:  Paige Johnston    MRN: [id number] DOB: 06-19-1933  06/28/2019  [name] was observed post Covid-19 immunization for 15 minutes without incidence. She was provided with Vaccine Information Sheet and instruction to access the V-Safe system.   [name] was instructed to call 911 with any severe reactions post vaccine: [name] Kitchen Difficulty breathing  . Swelling of your face and throat  . A fast heartbeat  . A bad rash all over your body  . Dizziness and weakness    Immunizations Administered    Name Date Dose VIS Date Route   Pfizer COVID-19 Vaccine 06/28/2019  1:12 PM 0.3 mL 04/10/2019 Intramuscular   Manufacturer: [place]   Lot: [id number]   [place]: [id number]

## 2019-07-28 ENCOUNTER — Ambulatory Visit: Payer: Medicare HMO | Attending: Internal Medicine

## 2019-07-28 DIAGNOSIS — Z23 Encounter for immunization: Secondary | ICD-10-CM

## 2019-07-28 NOTE — Progress Notes (Signed)
   Covid-19 Vaccination Clinic  Name:  Paige Johnston    MRN: [id number] DOB: 01-Dec-1933  07/28/2019  Ms. Paige Johnston was observed post Covid-19 immunization for 15 minutes without incident. She was provided with Vaccine Information Sheet and instruction to access the V-Safe system.   Ms. Paige Johnston was instructed to call 911 with any severe reactions post vaccine: [name] Kitchen Difficulty breathing  . Swelling of face and throat  . A fast heartbeat  . A bad rash all over body  . Dizziness and weakness   Immunizations Administered    Name Date Dose VIS Date Route   Pfizer COVID-19 Vaccine 07/28/2019  1:10 PM 0.3 mL 04/10/2019 Intramuscular   Manufacturer: [place]   Lot: [id number]   [place]: [id number]

## 2019-11-04 DIAGNOSIS — M170 Bilateral primary osteoarthritis of knee: Secondary | ICD-10-CM | POA: Diagnosis not present

## 2019-11-23 ENCOUNTER — Encounter: Payer: Self-pay | Admitting: Dermatology

## 2019-11-23 ENCOUNTER — Other Ambulatory Visit: Payer: Self-pay

## 2019-11-23 ENCOUNTER — Ambulatory Visit: Payer: Medicare HMO | Admitting: Dermatology

## 2019-11-23 ENCOUNTER — Telehealth: Payer: Self-pay | Admitting: Dermatology

## 2019-11-23 DIAGNOSIS — L570 Actinic keratosis: Secondary | ICD-10-CM

## 2019-11-23 DIAGNOSIS — L409 Psoriasis, unspecified: Secondary | ICD-10-CM | POA: Diagnosis not present

## 2019-11-23 MED ORDER — HALOBETASOL PROPIONATE 0.05 % EX CREA: TOPICAL_CREAM | Freq: Every day | CUTANEOUS | 3 refills | Status: AC

## 2019-11-23 NOTE — Patient Instructions (Addendum)
Follow-up visit for Paige Johnston date of birth 10/01/1933.  She is pleased with her forehead surgical result from Dr. Winifred Olive.  Examination shows no skin cancer on her forehead.  Around her nose, inner cheeks, and above the right brow are subtle pink flat crusts which represent solar keratoses.  This is not psoriasis and of no immediate threat to Mrs. Paige Johnston.  We may add some [place] cream in the winter for this.  On her arms more than her legs particularly around the elbows and knuckles she has typical plaque psoriasis.  I explained the nature of this treatable but not curable condition in some detail to Mrs. Paige Johnston.  Initially she will apply halobetasol cream or ointment to this area once daily after bathing.  This ointment should never be used on the face or the large body folds like under the arm or under the breast or the groin.  If there is enough improvement in 2 months then she has the option of canceling her follow-up.  If her health insurance will not cover the halobetasol, either Paige Johnston or the pharmacist will call me and we will substitute.

## 2019-11-23 NOTE — Telephone Encounter (Signed)
Patient left message on office voice mail saying that under the instruction part of summary that she is being referred to as Paige Johnston.  Her name needs to be chaged to Paige Johnston.

## 2019-12-04 DIAGNOSIS — M170 Bilateral primary osteoarthritis of knee: Secondary | ICD-10-CM | POA: Diagnosis not present

## 2019-12-04 DIAGNOSIS — M1712 Unilateral primary osteoarthritis, left knee: Secondary | ICD-10-CM | POA: Diagnosis not present

## 2019-12-04 DIAGNOSIS — M1711 Unilateral primary osteoarthritis, right knee: Secondary | ICD-10-CM | POA: Diagnosis not present

## 2019-12-08 ENCOUNTER — Telehealth: Payer: Self-pay | Admitting: Dermatology

## 2019-12-08 NOTE — Telephone Encounter (Signed)
Patient should have enough medication till October follow up call insurance and make sur they don't refill it.

## 2019-12-08 NOTE — Telephone Encounter (Signed)
Patient came by office saying that she received 3 tubes of Halobetasol 0.05% with a bill of $70.78.   Per patient she cannot afford this medication.  Is there another medication that Lavonna Monarch, MD can prescribe for her that is not as costly?  Patient uses [employer].

## 2019-12-09 NOTE — Telephone Encounter (Signed)
Still no answer x2 days

## 2019-12-18 DIAGNOSIS — F325 Major depressive disorder, single episode, in full remission: Secondary | ICD-10-CM | POA: Diagnosis not present

## 2019-12-18 DIAGNOSIS — E7800 Pure hypercholesterolemia, unspecified: Secondary | ICD-10-CM | POA: Diagnosis not present

## 2019-12-18 DIAGNOSIS — E1142 Type 2 diabetes mellitus with diabetic polyneuropathy: Secondary | ICD-10-CM | POA: Diagnosis not present

## 2019-12-18 DIAGNOSIS — E1169 Type 2 diabetes mellitus with other specified complication: Secondary | ICD-10-CM | POA: Diagnosis not present

## 2019-12-18 DIAGNOSIS — E1151 Type 2 diabetes mellitus with diabetic peripheral angiopathy without gangrene: Secondary | ICD-10-CM | POA: Diagnosis not present

## 2019-12-18 DIAGNOSIS — I10 Essential (primary) hypertension: Secondary | ICD-10-CM | POA: Diagnosis not present

## 2019-12-22 DIAGNOSIS — E1151 Type 2 diabetes mellitus with diabetic peripheral angiopathy without gangrene: Secondary | ICD-10-CM | POA: Diagnosis not present

## 2019-12-22 DIAGNOSIS — Z1389 Encounter for screening for other disorder: Secondary | ICD-10-CM | POA: Diagnosis not present

## 2019-12-22 DIAGNOSIS — K219 Gastro-esophageal reflux disease without esophagitis: Secondary | ICD-10-CM | POA: Diagnosis not present

## 2019-12-22 DIAGNOSIS — F325 Major depressive disorder, single episode, in full remission: Secondary | ICD-10-CM | POA: Diagnosis not present

## 2019-12-22 DIAGNOSIS — Z0000 Encounter for general adult medical examination without abnormal findings: Secondary | ICD-10-CM | POA: Diagnosis not present

## 2019-12-22 DIAGNOSIS — I10 Essential (primary) hypertension: Secondary | ICD-10-CM | POA: Diagnosis not present

## 2019-12-22 DIAGNOSIS — Z79899 Other long term (current) drug therapy: Secondary | ICD-10-CM | POA: Diagnosis not present

## 2019-12-22 DIAGNOSIS — E1169 Type 2 diabetes mellitus with other specified complication: Secondary | ICD-10-CM | POA: Diagnosis not present

## 2019-12-22 DIAGNOSIS — E7800 Pure hypercholesterolemia, unspecified: Secondary | ICD-10-CM | POA: Diagnosis not present

## 2019-12-22 DIAGNOSIS — Z7984 Long term (current) use of oral hypoglycemic drugs: Secondary | ICD-10-CM | POA: Diagnosis not present

## 2019-12-22 DIAGNOSIS — E1142 Type 2 diabetes mellitus with diabetic polyneuropathy: Secondary | ICD-10-CM | POA: Diagnosis not present

## 2019-12-30 ENCOUNTER — Encounter: Payer: Self-pay | Admitting: Dermatology

## 2019-12-30 NOTE — Progress Notes (Signed)
   Follow-Up Visit   Subjective  Paige Johnston is a 86 y.o. female who presents for the following: new problem (Last OV Nov 2020--possible psoriasis--itching, dry skin, redness hands.elbow for about 1 year. Tried no OTC meds.).  Psoriasis Location: Face Duration:  Quality: More spots Associated Signs/Symptoms: Modifying Factors:  Severity:  Timing: Context:   Objective  Well appearing patient in no apparent distress; mood and affect are within normal limits.  A focused examination was performed including Head, neck, arms, legs.. Relevant physical exam findings are noted in the Assessment and Plan.   Assessment & Plan    Psoriasis (4) Left Upper Arm - Posterior; Right Upper Arm - Posterior; Left Forearm - Posterior; Right Forearm - Posterior  Topical halobetasol daily after bathing for 1 month; follow-up by phone at that time  Ordered Medications: halobetasol (ULTRAVATE) 0.05 % cream  AK (actinic keratosis) (3) Left Buccal Cheek ; Left Forehead; Right Forehead  Defer intervention.     I, Lavonna Monarch, MD, have reviewed all documentation for this visit.  The documentation on 12/30/19 for the exam, diagnosis, procedures, and orders are all accurate and complete.

## 2020-01-21 DIAGNOSIS — E7800 Pure hypercholesterolemia, unspecified: Secondary | ICD-10-CM | POA: Diagnosis not present

## 2020-01-21 DIAGNOSIS — F325 Major depressive disorder, single episode, in full remission: Secondary | ICD-10-CM | POA: Diagnosis not present

## 2020-01-21 DIAGNOSIS — E1142 Type 2 diabetes mellitus with diabetic polyneuropathy: Secondary | ICD-10-CM | POA: Diagnosis not present

## 2020-01-21 DIAGNOSIS — E1169 Type 2 diabetes mellitus with other specified complication: Secondary | ICD-10-CM | POA: Diagnosis not present

## 2020-01-21 DIAGNOSIS — I10 Essential (primary) hypertension: Secondary | ICD-10-CM | POA: Diagnosis not present

## 2020-01-21 DIAGNOSIS — E1151 Type 2 diabetes mellitus with diabetic peripheral angiopathy without gangrene: Secondary | ICD-10-CM | POA: Diagnosis not present

## 2020-01-26 DIAGNOSIS — E7800 Pure hypercholesterolemia, unspecified: Secondary | ICD-10-CM | POA: Diagnosis not present

## 2020-01-26 DIAGNOSIS — Z79899 Other long term (current) drug therapy: Secondary | ICD-10-CM | POA: Diagnosis not present

## 2020-01-26 DIAGNOSIS — E1169 Type 2 diabetes mellitus with other specified complication: Secondary | ICD-10-CM | POA: Diagnosis not present

## 2020-02-08 DIAGNOSIS — F325 Major depressive disorder, single episode, in full remission: Secondary | ICD-10-CM | POA: Diagnosis not present

## 2020-02-08 DIAGNOSIS — E1151 Type 2 diabetes mellitus with diabetic peripheral angiopathy without gangrene: Secondary | ICD-10-CM | POA: Diagnosis not present

## 2020-02-08 DIAGNOSIS — E1142 Type 2 diabetes mellitus with diabetic polyneuropathy: Secondary | ICD-10-CM | POA: Diagnosis not present

## 2020-02-08 DIAGNOSIS — E1169 Type 2 diabetes mellitus with other specified complication: Secondary | ICD-10-CM | POA: Diagnosis not present

## 2020-02-08 DIAGNOSIS — E7800 Pure hypercholesterolemia, unspecified: Secondary | ICD-10-CM | POA: Diagnosis not present

## 2020-02-08 DIAGNOSIS — I10 Essential (primary) hypertension: Secondary | ICD-10-CM | POA: Diagnosis not present

## 2020-02-10 DIAGNOSIS — Z23 Encounter for immunization: Secondary | ICD-10-CM | POA: Diagnosis not present

## 2020-02-22 DIAGNOSIS — E119 Type 2 diabetes mellitus without complications: Secondary | ICD-10-CM | POA: Diagnosis not present

## 2020-02-23 ENCOUNTER — Ambulatory Visit: Payer: Medicare HMO | Admitting: Dermatology

## 2020-03-12 ENCOUNTER — Ambulatory Visit: Payer: Medicare HMO | Attending: Internal Medicine

## 2020-03-12 DIAGNOSIS — Z23 Encounter for immunization: Secondary | ICD-10-CM

## 2020-03-12 NOTE — Progress Notes (Signed)
   Covid-19 Vaccination Clinic  Name:  Paige Johnston    MRN: [id number] DOB: Jul 29, 1933  03/12/2020  [name] was observed post Covid-19 immunization for 15 minutes without incident. She was provided with Vaccine Information Sheet and instruction to access the V-Safe system.   [name] was instructed to call 911 with any severe reactions post vaccine: [name] Kitchen Difficulty breathing  . Swelling of face and throat  . A fast heartbeat  . A bad rash all over body  . Dizziness and weakness   Immunizations Administered    Name Date Dose VIS Date Route   Pfizer COVID-19 Vaccine 03/12/2020  1:40 PM 0.3 mL 02/17/2020 Intramuscular   Manufacturer: [place]   Lot: [id number]   [place]: [id number]

## 2020-03-25 DIAGNOSIS — E1142 Type 2 diabetes mellitus with diabetic polyneuropathy: Secondary | ICD-10-CM | POA: Diagnosis not present

## 2020-03-25 DIAGNOSIS — F325 Major depressive disorder, single episode, in full remission: Secondary | ICD-10-CM | POA: Diagnosis not present

## 2020-03-25 DIAGNOSIS — E1169 Type 2 diabetes mellitus with other specified complication: Secondary | ICD-10-CM | POA: Diagnosis not present

## 2020-03-25 DIAGNOSIS — I10 Essential (primary) hypertension: Secondary | ICD-10-CM | POA: Diagnosis not present

## 2020-03-25 DIAGNOSIS — E7800 Pure hypercholesterolemia, unspecified: Secondary | ICD-10-CM | POA: Diagnosis not present

## 2020-03-25 DIAGNOSIS — K219 Gastro-esophageal reflux disease without esophagitis: Secondary | ICD-10-CM | POA: Diagnosis not present

## 2020-03-25 DIAGNOSIS — E1151 Type 2 diabetes mellitus with diabetic peripheral angiopathy without gangrene: Secondary | ICD-10-CM | POA: Diagnosis not present

## 2020-03-30 DIAGNOSIS — M170 Bilateral primary osteoarthritis of knee: Secondary | ICD-10-CM | POA: Diagnosis not present

## 2020-04-06 DIAGNOSIS — M170 Bilateral primary osteoarthritis of knee: Secondary | ICD-10-CM | POA: Diagnosis not present

## 2020-04-13 DIAGNOSIS — M170 Bilateral primary osteoarthritis of knee: Secondary | ICD-10-CM | POA: Diagnosis not present

## 2020-04-20 ENCOUNTER — Other Ambulatory Visit: Payer: Self-pay | Admitting: Geriatric Medicine

## 2020-04-20 DIAGNOSIS — Z1231 Encounter for screening mammogram for malignant neoplasm of breast: Secondary | ICD-10-CM

## 2020-05-12 DIAGNOSIS — E1169 Type 2 diabetes mellitus with other specified complication: Secondary | ICD-10-CM | POA: Diagnosis not present

## 2020-05-12 DIAGNOSIS — E1151 Type 2 diabetes mellitus with diabetic peripheral angiopathy without gangrene: Secondary | ICD-10-CM | POA: Diagnosis not present

## 2020-05-12 DIAGNOSIS — K219 Gastro-esophageal reflux disease without esophagitis: Secondary | ICD-10-CM | POA: Diagnosis not present

## 2020-05-12 DIAGNOSIS — I10 Essential (primary) hypertension: Secondary | ICD-10-CM | POA: Diagnosis not present

## 2020-05-12 DIAGNOSIS — E1142 Type 2 diabetes mellitus with diabetic polyneuropathy: Secondary | ICD-10-CM | POA: Diagnosis not present

## 2020-05-12 DIAGNOSIS — E7800 Pure hypercholesterolemia, unspecified: Secondary | ICD-10-CM | POA: Diagnosis not present

## 2020-05-12 DIAGNOSIS — F325 Major depressive disorder, single episode, in full remission: Secondary | ICD-10-CM | POA: Diagnosis not present

## 2020-05-27 DIAGNOSIS — M170 Bilateral primary osteoarthritis of knee: Secondary | ICD-10-CM | POA: Diagnosis not present

## 2020-05-30 ENCOUNTER — Ambulatory Visit: Payer: Medicare HMO

## 2020-06-07 DIAGNOSIS — M170 Bilateral primary osteoarthritis of knee: Secondary | ICD-10-CM | POA: Diagnosis not present

## 2020-06-07 DIAGNOSIS — M6281 Muscle weakness (generalized): Secondary | ICD-10-CM | POA: Diagnosis not present

## 2020-06-13 DIAGNOSIS — M6281 Muscle weakness (generalized): Secondary | ICD-10-CM | POA: Diagnosis not present

## 2020-06-13 DIAGNOSIS — M170 Bilateral primary osteoarthritis of knee: Secondary | ICD-10-CM | POA: Diagnosis not present

## 2020-06-16 DIAGNOSIS — M170 Bilateral primary osteoarthritis of knee: Secondary | ICD-10-CM | POA: Diagnosis not present

## 2020-06-16 DIAGNOSIS — M6281 Muscle weakness (generalized): Secondary | ICD-10-CM | POA: Diagnosis not present

## 2020-06-21 DIAGNOSIS — M170 Bilateral primary osteoarthritis of knee: Secondary | ICD-10-CM | POA: Diagnosis not present

## 2020-06-21 DIAGNOSIS — M6281 Muscle weakness (generalized): Secondary | ICD-10-CM | POA: Diagnosis not present

## 2020-06-22 DIAGNOSIS — E1151 Type 2 diabetes mellitus with diabetic peripheral angiopathy without gangrene: Secondary | ICD-10-CM | POA: Diagnosis not present

## 2020-06-22 DIAGNOSIS — E1169 Type 2 diabetes mellitus with other specified complication: Secondary | ICD-10-CM | POA: Diagnosis not present

## 2020-06-22 DIAGNOSIS — G2581 Restless legs syndrome: Secondary | ICD-10-CM | POA: Diagnosis not present

## 2020-06-22 DIAGNOSIS — E1142 Type 2 diabetes mellitus with diabetic polyneuropathy: Secondary | ICD-10-CM | POA: Diagnosis not present

## 2020-06-22 DIAGNOSIS — Z7984 Long term (current) use of oral hypoglycemic drugs: Secondary | ICD-10-CM | POA: Diagnosis not present

## 2020-06-22 DIAGNOSIS — N3941 Urge incontinence: Secondary | ICD-10-CM | POA: Diagnosis not present

## 2020-06-22 DIAGNOSIS — I10 Essential (primary) hypertension: Secondary | ICD-10-CM | POA: Diagnosis not present

## 2020-06-24 DIAGNOSIS — M6281 Muscle weakness (generalized): Secondary | ICD-10-CM | POA: Diagnosis not present

## 2020-06-24 DIAGNOSIS — M170 Bilateral primary osteoarthritis of knee: Secondary | ICD-10-CM | POA: Diagnosis not present

## 2020-06-27 DIAGNOSIS — M6281 Muscle weakness (generalized): Secondary | ICD-10-CM | POA: Diagnosis not present

## 2020-06-27 DIAGNOSIS — M170 Bilateral primary osteoarthritis of knee: Secondary | ICD-10-CM | POA: Diagnosis not present

## 2020-06-30 DIAGNOSIS — M170 Bilateral primary osteoarthritis of knee: Secondary | ICD-10-CM | POA: Diagnosis not present

## 2020-06-30 DIAGNOSIS — M6281 Muscle weakness (generalized): Secondary | ICD-10-CM | POA: Diagnosis not present

## 2020-07-12 ENCOUNTER — Other Ambulatory Visit: Payer: Self-pay

## 2020-07-12 ENCOUNTER — Ambulatory Visit
Admission: RE | Admit: 2020-07-12 | Discharge: 2020-07-12 | Disposition: A | Discharge status: Home / Self Care | Payer: Medicare HMO | Source: Ambulatory Visit | Attending: Geriatric Medicine | Admitting: Geriatric Medicine

## 2020-07-12 DIAGNOSIS — Z1231 Encounter for screening mammogram for malignant neoplasm of breast: Secondary | ICD-10-CM

## 2020-07-12 HISTORY — DX: Malignant neoplasm of unspecified site of unspecified female breast: C50.919

## 2020-07-15 DIAGNOSIS — K219 Gastro-esophageal reflux disease without esophagitis: Secondary | ICD-10-CM | POA: Diagnosis not present

## 2020-07-15 DIAGNOSIS — I10 Essential (primary) hypertension: Secondary | ICD-10-CM | POA: Diagnosis not present

## 2020-07-15 DIAGNOSIS — E1151 Type 2 diabetes mellitus with diabetic peripheral angiopathy without gangrene: Secondary | ICD-10-CM | POA: Diagnosis not present

## 2020-07-15 DIAGNOSIS — F325 Major depressive disorder, single episode, in full remission: Secondary | ICD-10-CM | POA: Diagnosis not present

## 2020-07-15 DIAGNOSIS — E1142 Type 2 diabetes mellitus with diabetic polyneuropathy: Secondary | ICD-10-CM | POA: Diagnosis not present

## 2020-07-15 DIAGNOSIS — E7800 Pure hypercholesterolemia, unspecified: Secondary | ICD-10-CM | POA: Diagnosis not present

## 2020-07-15 DIAGNOSIS — E1169 Type 2 diabetes mellitus with other specified complication: Secondary | ICD-10-CM | POA: Diagnosis not present

## 2020-09-29 DIAGNOSIS — E1142 Type 2 diabetes mellitus with diabetic polyneuropathy: Secondary | ICD-10-CM | POA: Diagnosis not present

## 2020-09-29 DIAGNOSIS — E7800 Pure hypercholesterolemia, unspecified: Secondary | ICD-10-CM | POA: Diagnosis not present

## 2020-09-29 DIAGNOSIS — I10 Essential (primary) hypertension: Secondary | ICD-10-CM | POA: Diagnosis not present

## 2020-09-29 DIAGNOSIS — F325 Major depressive disorder, single episode, in full remission: Secondary | ICD-10-CM | POA: Diagnosis not present

## 2020-09-29 DIAGNOSIS — K219 Gastro-esophageal reflux disease without esophagitis: Secondary | ICD-10-CM | POA: Diagnosis not present

## 2020-09-29 DIAGNOSIS — E1151 Type 2 diabetes mellitus with diabetic peripheral angiopathy without gangrene: Secondary | ICD-10-CM | POA: Diagnosis not present

## 2020-09-29 DIAGNOSIS — E1169 Type 2 diabetes mellitus with other specified complication: Secondary | ICD-10-CM | POA: Diagnosis not present

## 2020-10-04 DIAGNOSIS — R300 Dysuria: Secondary | ICD-10-CM | POA: Diagnosis not present

## 2020-10-04 DIAGNOSIS — I10 Essential (primary) hypertension: Secondary | ICD-10-CM | POA: Diagnosis not present

## 2020-10-04 DIAGNOSIS — N3941 Urge incontinence: Secondary | ICD-10-CM | POA: Diagnosis not present

## 2020-10-04 DIAGNOSIS — R110 Nausea: Secondary | ICD-10-CM | POA: Diagnosis not present

## 2020-10-20 DIAGNOSIS — Z0100 Encounter for examination of eyes and vision without abnormal findings: Secondary | ICD-10-CM | POA: Diagnosis not present

## 2020-10-25 DIAGNOSIS — E1169 Type 2 diabetes mellitus with other specified complication: Secondary | ICD-10-CM | POA: Diagnosis not present

## 2020-10-25 DIAGNOSIS — E1142 Type 2 diabetes mellitus with diabetic polyneuropathy: Secondary | ICD-10-CM | POA: Diagnosis not present

## 2020-10-25 DIAGNOSIS — E1151 Type 2 diabetes mellitus with diabetic peripheral angiopathy without gangrene: Secondary | ICD-10-CM | POA: Diagnosis not present

## 2020-10-25 DIAGNOSIS — I10 Essential (primary) hypertension: Secondary | ICD-10-CM | POA: Diagnosis not present

## 2020-11-29 DIAGNOSIS — K219 Gastro-esophageal reflux disease without esophagitis: Secondary | ICD-10-CM | POA: Diagnosis not present

## 2020-11-29 DIAGNOSIS — E1169 Type 2 diabetes mellitus with other specified complication: Secondary | ICD-10-CM | POA: Diagnosis not present

## 2020-11-29 DIAGNOSIS — F325 Major depressive disorder, single episode, in full remission: Secondary | ICD-10-CM | POA: Diagnosis not present

## 2020-11-29 DIAGNOSIS — I10 Essential (primary) hypertension: Secondary | ICD-10-CM | POA: Diagnosis not present

## 2020-11-29 DIAGNOSIS — E1142 Type 2 diabetes mellitus with diabetic polyneuropathy: Secondary | ICD-10-CM | POA: Diagnosis not present

## 2020-11-29 DIAGNOSIS — E7800 Pure hypercholesterolemia, unspecified: Secondary | ICD-10-CM | POA: Diagnosis not present

## 2020-11-29 DIAGNOSIS — E1151 Type 2 diabetes mellitus with diabetic peripheral angiopathy without gangrene: Secondary | ICD-10-CM | POA: Diagnosis not present

## 2020-12-20 ENCOUNTER — Other Ambulatory Visit: Payer: Self-pay

## 2020-12-20 ENCOUNTER — Ambulatory Visit: Payer: Medicare HMO | Admitting: Dermatology

## 2020-12-20 ENCOUNTER — Encounter: Payer: Self-pay | Admitting: Dermatology

## 2020-12-20 DIAGNOSIS — Z1283 Encounter for screening for malignant neoplasm of skin: Secondary | ICD-10-CM | POA: Diagnosis not present

## 2020-12-20 DIAGNOSIS — D485 Neoplasm of uncertain behavior of skin: Secondary | ICD-10-CM

## 2020-12-20 DIAGNOSIS — C44529 Squamous cell carcinoma of skin of other part of trunk: Secondary | ICD-10-CM | POA: Diagnosis not present

## 2020-12-20 DIAGNOSIS — L409 Psoriasis, unspecified: Secondary | ICD-10-CM

## 2020-12-20 MED ORDER — CLOBETASOL PROPIONATE 0.05 % EX OINT: TOPICAL_OINTMENT | CUTANEOUS | 2 refills | Status: AC

## 2020-12-23 DIAGNOSIS — E1169 Type 2 diabetes mellitus with other specified complication: Secondary | ICD-10-CM | POA: Diagnosis not present

## 2020-12-23 DIAGNOSIS — Z79899 Other long term (current) drug therapy: Secondary | ICD-10-CM | POA: Diagnosis not present

## 2020-12-23 DIAGNOSIS — I10 Essential (primary) hypertension: Secondary | ICD-10-CM | POA: Diagnosis not present

## 2020-12-23 DIAGNOSIS — G2581 Restless legs syndrome: Secondary | ICD-10-CM | POA: Diagnosis not present

## 2020-12-23 DIAGNOSIS — Z1389 Encounter for screening for other disorder: Secondary | ICD-10-CM | POA: Diagnosis not present

## 2020-12-23 DIAGNOSIS — Z0000 Encounter for general adult medical examination without abnormal findings: Secondary | ICD-10-CM | POA: Diagnosis not present

## 2020-12-23 DIAGNOSIS — E1142 Type 2 diabetes mellitus with diabetic polyneuropathy: Secondary | ICD-10-CM | POA: Diagnosis not present

## 2020-12-23 DIAGNOSIS — K219 Gastro-esophageal reflux disease without esophagitis: Secondary | ICD-10-CM | POA: Diagnosis not present

## 2020-12-23 DIAGNOSIS — E7800 Pure hypercholesterolemia, unspecified: Secondary | ICD-10-CM | POA: Diagnosis not present

## 2020-12-23 DIAGNOSIS — E1151 Type 2 diabetes mellitus with diabetic peripheral angiopathy without gangrene: Secondary | ICD-10-CM | POA: Diagnosis not present

## 2020-12-23 NOTE — Progress Notes (Signed)
   Follow-Up Visit   Subjective  Paige Johnston is a 87 y.o. female who presents for the following: Skin Problem (Right shoulder- hanging mole).  General skin check.  New growth right back shoulder.  Recheck psoriasis. Location:  Duration:  Quality:  Associated Signs/Symptoms: Modifying Factors:  Severity:  Timing: Context:   Objective  Well appearing patient in no apparent distress; mood and affect are within normal limits. Right Upper Back 6 mm thick cutaneous horn, rule out SCCA.       Torso - Posterior (Back) Waist up exam no atypical pigmented lesions.  1 possible skin cancer right upper back will be biopsied and treated.  Right Arm Rather localized small to medium plaque psoriasis.    Waist up skin examination; areas beneath undergarments not fully examined.   Assessment & Plan    Neoplasm of uncertain behavior of skin Right Upper Back  Skin / nail biopsy Type of biopsy: tangential   Informed consent: discussed and consent obtained   Timeout: patient name, date of birth, surgical site, and procedure verified   Anesthesia: the lesion was anesthetized in a standard fashion   Anesthetic:  1% lidocaine w/ epinephrine 1-100,000 local infiltration Instrument used: flexible razor blade   Hemostasis achieved with: aluminum chloride and electrodesiccation   Outcome: patient tolerated procedure well   Post-procedure details: wound care instructions given    Destruction of lesion Complexity: simple   Destruction method: electrodesiccation and curettage   Informed consent: discussed and consent obtained   Timeout:  patient name, date of birth, surgical site, and procedure verified Anesthesia: the lesion was anesthetized in a standard fashion   Anesthetic:  1% lidocaine w/ epinephrine 1-100,000 local infiltration Lesion length (cm):  0.6 Lesion width (cm):  0.6 Margin per side (cm):  0 Final wound size (cm):  0.6 Hemostasis achieved with:  ferric  subsulfate Outcome: patient tolerated procedure well with no complications   Post-procedure details: sterile dressing applied and wound care instructions given   Dressing type: bandage and petrolatum   Additional details:  Cautery without curette.  Specimen 1 - Surgical pathology Differential Diagnosis: cutaneous horn cautery only  Check Margins: No  After deep shave biopsy there was no obvious deep keratin seen so the base and margins were cauterized.  Screening exam for skin cancer Torso - Posterior (Back)  Keep yearly exams   Psoriasis Right Arm  Clobetasol cream daily after bathing for 3 to 4 weeks.  If improved she may taper the frequency of application.  Avoid using this on the face and body folds.  clobetasol ointment (TEMOVATE) 0.05 % - Right Arm Apply to rash one to twice daily not for face or skin folds  Related Medications halobetasol (ULTRAVATE) 0.05 % cream Apply topically daily. 3 month supply apply after bathing qd.      I, Lavonna Monarch, MD, have reviewed all documentation for this visit.  The documentation on 12/23/20 for the exam, diagnosis, procedures, and orders are all accurate and complete.

## 2020-12-26 ENCOUNTER — Telehealth: Payer: Self-pay | Admitting: *Deleted

## 2020-12-26 NOTE — Telephone Encounter (Signed)
Path to patient made 3 month follow up with Dr.Tafeen.

## 2020-12-26 NOTE — Telephone Encounter (Signed)
-----   Message from Lavonna Monarch, MD sent at 12/23/2020  6:29 PM EDT ----- Biopsy was done with deep shave and the base cauterized so there should be roughly 90% cure.  Routine recheck in 3 months.

## 2021-03-13 DIAGNOSIS — E119 Type 2 diabetes mellitus without complications: Secondary | ICD-10-CM | POA: Diagnosis not present

## 2021-03-14 ENCOUNTER — Ambulatory Visit: Payer: Medicare HMO | Admitting: Dermatology

## 2021-03-14 ENCOUNTER — Encounter: Payer: Self-pay | Admitting: Dermatology

## 2021-03-14 ENCOUNTER — Other Ambulatory Visit: Payer: Self-pay

## 2021-03-14 DIAGNOSIS — L409 Psoriasis, unspecified: Secondary | ICD-10-CM

## 2021-03-14 DIAGNOSIS — Z85828 Personal history of other malignant neoplasm of skin: Secondary | ICD-10-CM | POA: Diagnosis not present

## 2021-03-14 DIAGNOSIS — Z8589 Personal history of malignant neoplasm of other organs and systems: Secondary | ICD-10-CM

## 2021-03-14 MED ORDER — CLOBETASOL PROPIONATE 0.05 % EX FOAM: Freq: Two times a day (BID) | CUTANEOUS | 0 refills | Status: AC

## 2021-03-16 ENCOUNTER — Ambulatory Visit: Payer: Medicare HMO | Admitting: Dermatology

## 2021-04-10 ENCOUNTER — Encounter: Payer: Self-pay | Admitting: Dermatology

## 2021-04-10 NOTE — Progress Notes (Signed)
   Follow-Up Visit   Subjective  Paige Johnston is a 86 y.o. female who presents for the following: Follow-up (Follow up on the biopsy on the back scc and its much better ).  Check site of skin cancer plus psoriasis scalp Location:  Duration:  Quality:  Associated Signs/Symptoms: Modifying Factors:  Severity:  Timing: Context:   Objective  Well appearing patient in no apparent distress; mood and affect are within normal limits. Right Upper Back Scar clear and itching has improved  Scalp persistent itching and scale with itching most severe on the scalp    A focused examination was performed including head neck face upper torso and arms.. Relevant physical exam findings are noted in the Assessment and Plan.   Assessment & Plan    History of squamous cell carcinoma Right Upper Back  Check as needed clinical change  Psoriasis Scalp  Reviewed essentially all psoriasis treatment options including systemic's.  Goodrx coupon given for topical clobetasol foam.  To contact us if out-of-pocket cost is too high.  Try this daily for 4 weeks and then contact us with status update  clobetasol (OLUX) 0.05 % topical foam - Scalp Apply topically 2 (two) times daily.  Related Medications halobetasol (ULTRAVATE) 0.05 % cream Apply topically daily. 3 month supply apply after bathing qd.  clobetasol ointment (TEMOVATE) 0.05 % Apply to rash one to twice daily not for face or skin folds      I, Lavonna Monarch, MD, have reviewed all documentation for this visit.  The documentation on 04/10/21 for the exam, diagnosis, procedures, and orders are all accurate and complete.

## 2021-04-26 DIAGNOSIS — K5901 Slow transit constipation: Secondary | ICD-10-CM | POA: Diagnosis not present

## 2021-04-26 DIAGNOSIS — R0981 Nasal congestion: Secondary | ICD-10-CM | POA: Diagnosis not present

## 2021-04-26 DIAGNOSIS — I10 Essential (primary) hypertension: Secondary | ICD-10-CM | POA: Diagnosis not present

## 2021-04-26 DIAGNOSIS — E1142 Type 2 diabetes mellitus with diabetic polyneuropathy: Secondary | ICD-10-CM | POA: Diagnosis not present

## 2021-07-14 ENCOUNTER — Other Ambulatory Visit: Payer: Self-pay | Admitting: Geriatric Medicine

## 2021-07-14 DIAGNOSIS — Z1231 Encounter for screening mammogram for malignant neoplasm of breast: Secondary | ICD-10-CM

## 2021-07-18 ENCOUNTER — Ambulatory Visit
Admission: RE | Admit: 2021-07-18 | Discharge: 2021-07-18 | Disposition: A | Discharge status: Home / Self Care | Payer: Medicare HMO | Source: Ambulatory Visit | Attending: Geriatric Medicine | Admitting: Geriatric Medicine

## 2021-07-18 DIAGNOSIS — Z1231 Encounter for screening mammogram for malignant neoplasm of breast: Secondary | ICD-10-CM | POA: Diagnosis not present

## 2021-09-01 DIAGNOSIS — R300 Dysuria: Secondary | ICD-10-CM | POA: Diagnosis not present

## 2021-10-20 DIAGNOSIS — F325 Major depressive disorder, single episode, in full remission: Secondary | ICD-10-CM | POA: Diagnosis not present

## 2021-10-20 DIAGNOSIS — K219 Gastro-esophageal reflux disease without esophagitis: Secondary | ICD-10-CM | POA: Diagnosis not present

## 2021-10-20 DIAGNOSIS — E7800 Pure hypercholesterolemia, unspecified: Secondary | ICD-10-CM | POA: Diagnosis not present

## 2021-10-20 DIAGNOSIS — E1169 Type 2 diabetes mellitus with other specified complication: Secondary | ICD-10-CM | POA: Diagnosis not present

## 2021-10-20 DIAGNOSIS — I10 Essential (primary) hypertension: Secondary | ICD-10-CM | POA: Diagnosis not present

## 2021-11-16 DIAGNOSIS — E1169 Type 2 diabetes mellitus with other specified complication: Secondary | ICD-10-CM | POA: Diagnosis not present

## 2021-11-16 DIAGNOSIS — F325 Major depressive disorder, single episode, in full remission: Secondary | ICD-10-CM | POA: Diagnosis not present

## 2021-11-16 DIAGNOSIS — I10 Essential (primary) hypertension: Secondary | ICD-10-CM | POA: Diagnosis not present

## 2021-11-16 DIAGNOSIS — K219 Gastro-esophageal reflux disease without esophagitis: Secondary | ICD-10-CM | POA: Diagnosis not present

## 2021-11-16 DIAGNOSIS — E7800 Pure hypercholesterolemia, unspecified: Secondary | ICD-10-CM | POA: Diagnosis not present

## 2021-11-29 DIAGNOSIS — M170 Bilateral primary osteoarthritis of knee: Secondary | ICD-10-CM | POA: Diagnosis not present

## 2021-11-30 ENCOUNTER — Ambulatory Visit: Payer: Self-pay

## 2021-11-30 NOTE — Patient Outreach (Signed)
  Care Coordination   11/30/2021 Name: Paige Johnston MRN: [id number] DOB: Aug 08, 1933   Care Coordination Outreach Attempts:  An unsuccessful telephone outreach was attempted today to offer the patient information about available care coordination services as a benefit of their health plan.   Follow Up Plan:  Additional outreach attempts will be made to offer the patient care coordination information and services.   Encounter Outcome:  No Answer  Care Coordination Interventions Activated:  No   Care Coordination Interventions:  No, not indicated     [facility] [phone]

## 2021-12-06 DIAGNOSIS — M170 Bilateral primary osteoarthritis of knee: Secondary | ICD-10-CM | POA: Diagnosis not present

## 2021-12-15 DIAGNOSIS — M170 Bilateral primary osteoarthritis of knee: Secondary | ICD-10-CM | POA: Diagnosis not present

## 2021-12-15 DIAGNOSIS — M1712 Unilateral primary osteoarthritis, left knee: Secondary | ICD-10-CM | POA: Diagnosis not present

## 2021-12-15 DIAGNOSIS — M1711 Unilateral primary osteoarthritis, right knee: Secondary | ICD-10-CM | POA: Diagnosis not present

## 2021-12-20 ENCOUNTER — Ambulatory Visit: Payer: Medicare HMO | Admitting: Dermatology

## 2022-01-08 DIAGNOSIS — N3941 Urge incontinence: Secondary | ICD-10-CM | POA: Diagnosis not present

## 2022-01-08 DIAGNOSIS — K219 Gastro-esophageal reflux disease without esophagitis: Secondary | ICD-10-CM | POA: Diagnosis not present

## 2022-01-08 DIAGNOSIS — E1151 Type 2 diabetes mellitus with diabetic peripheral angiopathy without gangrene: Secondary | ICD-10-CM | POA: Diagnosis not present

## 2022-01-08 DIAGNOSIS — I10 Essential (primary) hypertension: Secondary | ICD-10-CM | POA: Diagnosis not present

## 2022-01-08 DIAGNOSIS — E1142 Type 2 diabetes mellitus with diabetic polyneuropathy: Secondary | ICD-10-CM | POA: Diagnosis not present

## 2022-01-08 DIAGNOSIS — Z79899 Other long term (current) drug therapy: Secondary | ICD-10-CM | POA: Diagnosis not present

## 2022-01-08 DIAGNOSIS — Z23 Encounter for immunization: Secondary | ICD-10-CM | POA: Diagnosis not present

## 2022-01-08 DIAGNOSIS — Z0000 Encounter for general adult medical examination without abnormal findings: Secondary | ICD-10-CM | POA: Diagnosis not present

## 2022-01-08 DIAGNOSIS — E1169 Type 2 diabetes mellitus with other specified complication: Secondary | ICD-10-CM | POA: Diagnosis not present

## 2022-01-24 DIAGNOSIS — M1711 Unilateral primary osteoarthritis, right knee: Secondary | ICD-10-CM | POA: Diagnosis not present

## 2022-01-24 DIAGNOSIS — M170 Bilateral primary osteoarthritis of knee: Secondary | ICD-10-CM | POA: Diagnosis not present

## 2022-01-24 DIAGNOSIS — M1712 Unilateral primary osteoarthritis, left knee: Secondary | ICD-10-CM | POA: Diagnosis not present

## 2022-02-06 DIAGNOSIS — M25561 Pain in right knee: Secondary | ICD-10-CM | POA: Diagnosis not present

## 2022-02-06 DIAGNOSIS — M25562 Pain in left knee: Secondary | ICD-10-CM | POA: Diagnosis not present

## 2022-02-12 DIAGNOSIS — M25562 Pain in left knee: Secondary | ICD-10-CM | POA: Diagnosis not present

## 2022-02-12 DIAGNOSIS — M25561 Pain in right knee: Secondary | ICD-10-CM | POA: Diagnosis not present

## 2022-02-15 DIAGNOSIS — M25561 Pain in right knee: Secondary | ICD-10-CM | POA: Diagnosis not present

## 2022-02-15 DIAGNOSIS — M25562 Pain in left knee: Secondary | ICD-10-CM | POA: Diagnosis not present

## 2022-02-19 DIAGNOSIS — M25561 Pain in right knee: Secondary | ICD-10-CM | POA: Diagnosis not present

## 2022-02-19 DIAGNOSIS — M25562 Pain in left knee: Secondary | ICD-10-CM | POA: Diagnosis not present

## 2022-02-22 DIAGNOSIS — M25561 Pain in right knee: Secondary | ICD-10-CM | POA: Diagnosis not present

## 2022-02-22 DIAGNOSIS — M25562 Pain in left knee: Secondary | ICD-10-CM | POA: Diagnosis not present

## 2022-02-26 DIAGNOSIS — M25561 Pain in right knee: Secondary | ICD-10-CM | POA: Diagnosis not present

## 2022-02-26 DIAGNOSIS — M25562 Pain in left knee: Secondary | ICD-10-CM | POA: Diagnosis not present

## 2022-03-01 DIAGNOSIS — M25561 Pain in right knee: Secondary | ICD-10-CM | POA: Diagnosis not present

## 2022-03-01 DIAGNOSIS — M25562 Pain in left knee: Secondary | ICD-10-CM | POA: Diagnosis not present

## 2022-03-05 DIAGNOSIS — M25562 Pain in left knee: Secondary | ICD-10-CM | POA: Diagnosis not present

## 2022-03-05 DIAGNOSIS — M25561 Pain in right knee: Secondary | ICD-10-CM | POA: Diagnosis not present

## 2022-03-08 DIAGNOSIS — M25562 Pain in left knee: Secondary | ICD-10-CM | POA: Diagnosis not present

## 2022-03-08 DIAGNOSIS — M25561 Pain in right knee: Secondary | ICD-10-CM | POA: Diagnosis not present

## 2022-03-14 DIAGNOSIS — E119 Type 2 diabetes mellitus without complications: Secondary | ICD-10-CM | POA: Diagnosis not present

## 2022-05-02 DIAGNOSIS — M170 Bilateral primary osteoarthritis of knee: Secondary | ICD-10-CM | POA: Diagnosis not present

## 2022-05-02 DIAGNOSIS — M1712 Unilateral primary osteoarthritis, left knee: Secondary | ICD-10-CM | POA: Diagnosis not present

## 2022-06-07 ENCOUNTER — Other Ambulatory Visit: Payer: Self-pay | Admitting: Internal Medicine

## 2022-06-07 DIAGNOSIS — Z1231 Encounter for screening mammogram for malignant neoplasm of breast: Secondary | ICD-10-CM

## 2022-06-14 ENCOUNTER — Other Ambulatory Visit: Payer: Self-pay | Admitting: Internal Medicine

## 2022-06-14 DIAGNOSIS — N644 Mastodynia: Secondary | ICD-10-CM

## 2022-06-28 ENCOUNTER — Other Ambulatory Visit: Payer: Medicare HMO

## 2022-06-29 ENCOUNTER — Ambulatory Visit: Admission: RE | Admit: 2022-06-29 | Payer: Medicare HMO | Source: Ambulatory Visit

## 2022-06-29 ENCOUNTER — Ambulatory Visit
Admission: RE | Admit: 2022-06-29 | Discharge: 2022-06-29 | Disposition: A | Discharge status: Home / Self Care | Payer: Medicare HMO | Source: Ambulatory Visit | Attending: Internal Medicine | Admitting: Internal Medicine

## 2022-06-29 ENCOUNTER — Other Ambulatory Visit: Payer: Self-pay | Admitting: Internal Medicine

## 2022-06-29 DIAGNOSIS — N644 Mastodynia: Secondary | ICD-10-CM | POA: Diagnosis not present

## 2022-07-04 DIAGNOSIS — M170 Bilateral primary osteoarthritis of knee: Secondary | ICD-10-CM | POA: Diagnosis not present

## 2022-07-09 DIAGNOSIS — I10 Essential (primary) hypertension: Secondary | ICD-10-CM | POA: Diagnosis not present

## 2022-07-09 DIAGNOSIS — E1169 Type 2 diabetes mellitus with other specified complication: Secondary | ICD-10-CM | POA: Diagnosis not present

## 2022-07-09 DIAGNOSIS — E1151 Type 2 diabetes mellitus with diabetic peripheral angiopathy without gangrene: Secondary | ICD-10-CM | POA: Diagnosis not present

## 2022-07-09 DIAGNOSIS — E1142 Type 2 diabetes mellitus with diabetic polyneuropathy: Secondary | ICD-10-CM | POA: Diagnosis not present

## 2022-07-11 DIAGNOSIS — M170 Bilateral primary osteoarthritis of knee: Secondary | ICD-10-CM | POA: Diagnosis not present

## 2022-07-18 DIAGNOSIS — M170 Bilateral primary osteoarthritis of knee: Secondary | ICD-10-CM | POA: Diagnosis not present

## 2022-07-20 DIAGNOSIS — I10 Essential (primary) hypertension: Secondary | ICD-10-CM | POA: Diagnosis not present

## 2022-07-20 DIAGNOSIS — R209 Unspecified disturbances of skin sensation: Secondary | ICD-10-CM | POA: Diagnosis not present

## 2022-07-20 DIAGNOSIS — E1142 Type 2 diabetes mellitus with diabetic polyneuropathy: Secondary | ICD-10-CM | POA: Diagnosis not present

## 2022-11-20 DIAGNOSIS — L400 Psoriasis vulgaris: Secondary | ICD-10-CM | POA: Diagnosis not present

## 2022-11-22 DIAGNOSIS — M170 Bilateral primary osteoarthritis of knee: Secondary | ICD-10-CM | POA: Diagnosis not present

## 2022-11-22 DIAGNOSIS — M1711 Unilateral primary osteoarthritis, right knee: Secondary | ICD-10-CM | POA: Diagnosis not present

## 2023-01-30 DIAGNOSIS — H903 Sensorineural hearing loss, bilateral: Secondary | ICD-10-CM | POA: Diagnosis not present

## 2023-02-05 DIAGNOSIS — E7800 Pure hypercholesterolemia, unspecified: Secondary | ICD-10-CM | POA: Diagnosis not present

## 2023-02-05 DIAGNOSIS — E1121 Type 2 diabetes mellitus with diabetic nephropathy: Secondary | ICD-10-CM | POA: Diagnosis not present

## 2023-02-05 DIAGNOSIS — Z79899 Other long term (current) drug therapy: Secondary | ICD-10-CM | POA: Diagnosis not present

## 2023-02-05 DIAGNOSIS — Z1331 Encounter for screening for depression: Secondary | ICD-10-CM | POA: Diagnosis not present

## 2023-02-05 DIAGNOSIS — N3941 Urge incontinence: Secondary | ICD-10-CM | POA: Diagnosis not present

## 2023-02-05 DIAGNOSIS — Z23 Encounter for immunization: Secondary | ICD-10-CM | POA: Diagnosis not present

## 2023-02-05 DIAGNOSIS — Z0000 Encounter for general adult medical examination without abnormal findings: Secondary | ICD-10-CM | POA: Diagnosis not present

## 2023-02-05 DIAGNOSIS — K219 Gastro-esophageal reflux disease without esophagitis: Secondary | ICD-10-CM | POA: Diagnosis not present

## 2023-02-05 DIAGNOSIS — I10 Essential (primary) hypertension: Secondary | ICD-10-CM | POA: Diagnosis not present

## 2023-02-05 DIAGNOSIS — E1142 Type 2 diabetes mellitus with diabetic polyneuropathy: Secondary | ICD-10-CM | POA: Diagnosis not present

## 2023-02-05 DIAGNOSIS — E1151 Type 2 diabetes mellitus with diabetic peripheral angiopathy without gangrene: Secondary | ICD-10-CM | POA: Diagnosis not present

## 2023-02-05 DIAGNOSIS — E1169 Type 2 diabetes mellitus with other specified complication: Secondary | ICD-10-CM | POA: Diagnosis not present

## 2023-02-05 DIAGNOSIS — K909 Intestinal malabsorption, unspecified: Secondary | ICD-10-CM | POA: Diagnosis not present

## 2023-02-06 ENCOUNTER — Other Ambulatory Visit (HOSPITAL_COMMUNITY): Payer: Self-pay | Admitting: Internal Medicine

## 2023-02-06 DIAGNOSIS — R112 Nausea with vomiting, unspecified: Secondary | ICD-10-CM

## 2023-02-14 ENCOUNTER — Encounter (HOSPITAL_COMMUNITY)
Admission: RE | Admit: 2023-02-14 | Discharge: 2023-02-14 | Disposition: A | Discharge status: Home / Self Care | Payer: Medicare HMO | Source: Ambulatory Visit | Attending: Internal Medicine | Admitting: Internal Medicine

## 2023-02-14 DIAGNOSIS — R112 Nausea with vomiting, unspecified: Secondary | ICD-10-CM | POA: Insufficient documentation

## 2023-02-14 MED ORDER — TECHNETIUM TC 99M SULFUR COLLOID
2.0200 | Freq: Once | INTRAVENOUS | Status: AC
  Administered 2023-02-14: 2.02 via ORAL

## 2023-03-04 DIAGNOSIS — M170 Bilateral primary osteoarthritis of knee: Secondary | ICD-10-CM | POA: Diagnosis not present

## 2023-03-11 DIAGNOSIS — M170 Bilateral primary osteoarthritis of knee: Secondary | ICD-10-CM | POA: Diagnosis not present

## 2023-03-15 DIAGNOSIS — E119 Type 2 diabetes mellitus without complications: Secondary | ICD-10-CM | POA: Diagnosis not present

## 2023-03-19 DIAGNOSIS — M170 Bilateral primary osteoarthritis of knee: Secondary | ICD-10-CM | POA: Diagnosis not present

## 2023-06-21 ENCOUNTER — Other Ambulatory Visit: Payer: Self-pay | Admitting: Internal Medicine

## 2023-06-21 DIAGNOSIS — Z1231 Encounter for screening mammogram for malignant neoplasm of breast: Secondary | ICD-10-CM

## 2023-07-12 ENCOUNTER — Ambulatory Visit
Admission: RE | Admit: 2023-07-12 | Discharge: 2023-07-12 | Disposition: A | Discharge status: Home / Self Care | Payer: Medicare HMO | Source: Ambulatory Visit | Attending: Internal Medicine | Admitting: Internal Medicine

## 2023-07-12 DIAGNOSIS — Z1231 Encounter for screening mammogram for malignant neoplasm of breast: Secondary | ICD-10-CM

## 2023-08-07 DIAGNOSIS — K219 Gastro-esophageal reflux disease without esophagitis: Secondary | ICD-10-CM | POA: Diagnosis not present

## 2023-08-07 DIAGNOSIS — E1151 Type 2 diabetes mellitus with diabetic peripheral angiopathy without gangrene: Secondary | ICD-10-CM | POA: Diagnosis not present

## 2023-08-07 DIAGNOSIS — E1121 Type 2 diabetes mellitus with diabetic nephropathy: Secondary | ICD-10-CM | POA: Diagnosis not present

## 2023-08-07 DIAGNOSIS — R232 Flushing: Secondary | ICD-10-CM | POA: Diagnosis not present

## 2023-08-07 DIAGNOSIS — N3941 Urge incontinence: Secondary | ICD-10-CM | POA: Diagnosis not present

## 2023-08-07 DIAGNOSIS — G629 Polyneuropathy, unspecified: Secondary | ICD-10-CM | POA: Diagnosis not present

## 2023-08-07 DIAGNOSIS — R112 Nausea with vomiting, unspecified: Secondary | ICD-10-CM | POA: Diagnosis not present

## 2023-08-07 DIAGNOSIS — I10 Essential (primary) hypertension: Secondary | ICD-10-CM | POA: Diagnosis not present

## 2023-08-07 DIAGNOSIS — E1169 Type 2 diabetes mellitus with other specified complication: Secondary | ICD-10-CM | POA: Diagnosis not present

## 2023-08-07 DIAGNOSIS — E1142 Type 2 diabetes mellitus with diabetic polyneuropathy: Secondary | ICD-10-CM | POA: Diagnosis not present

## 2023-08-30 DIAGNOSIS — R748 Abnormal levels of other serum enzymes: Secondary | ICD-10-CM | POA: Diagnosis not present

## 2023-10-17 DIAGNOSIS — M170 Bilateral primary osteoarthritis of knee: Secondary | ICD-10-CM | POA: Diagnosis not present

## 2023-10-24 DIAGNOSIS — M170 Bilateral primary osteoarthritis of knee: Secondary | ICD-10-CM | POA: Diagnosis not present

## 2023-10-31 DIAGNOSIS — M170 Bilateral primary osteoarthritis of knee: Secondary | ICD-10-CM | POA: Diagnosis not present

## 2023-12-05 DIAGNOSIS — H0102A Squamous blepharitis right eye, upper and lower eyelids: Secondary | ICD-10-CM | POA: Diagnosis not present

## 2023-12-16 DIAGNOSIS — H0102A Squamous blepharitis right eye, upper and lower eyelids: Secondary | ICD-10-CM | POA: Diagnosis not present

## 2023-12-17 DIAGNOSIS — H01002 Unspecified blepharitis right lower eyelid: Secondary | ICD-10-CM | POA: Diagnosis not present

## 2024-02-07 DIAGNOSIS — Z1331 Encounter for screening for depression: Secondary | ICD-10-CM | POA: Diagnosis not present

## 2024-02-07 DIAGNOSIS — R300 Dysuria: Secondary | ICD-10-CM | POA: Diagnosis not present

## 2024-02-07 DIAGNOSIS — I10 Essential (primary) hypertension: Secondary | ICD-10-CM | POA: Diagnosis not present

## 2024-02-07 DIAGNOSIS — E1151 Type 2 diabetes mellitus with diabetic peripheral angiopathy without gangrene: Secondary | ICD-10-CM | POA: Diagnosis not present

## 2024-02-07 DIAGNOSIS — K909 Intestinal malabsorption, unspecified: Secondary | ICD-10-CM | POA: Diagnosis not present

## 2024-02-07 DIAGNOSIS — E1169 Type 2 diabetes mellitus with other specified complication: Secondary | ICD-10-CM | POA: Diagnosis not present

## 2024-02-07 DIAGNOSIS — Z23 Encounter for immunization: Secondary | ICD-10-CM | POA: Diagnosis not present

## 2024-02-07 DIAGNOSIS — Z0000 Encounter for general adult medical examination without abnormal findings: Secondary | ICD-10-CM | POA: Diagnosis not present

## 2024-02-07 DIAGNOSIS — E7800 Pure hypercholesterolemia, unspecified: Secondary | ICD-10-CM | POA: Diagnosis not present

## 2024-02-07 DIAGNOSIS — E1142 Type 2 diabetes mellitus with diabetic polyneuropathy: Secondary | ICD-10-CM | POA: Diagnosis not present

## 2024-02-07 DIAGNOSIS — Z79899 Other long term (current) drug therapy: Secondary | ICD-10-CM | POA: Diagnosis not present

## 2024-02-08 ENCOUNTER — Other Ambulatory Visit (HOSPITAL_BASED_OUTPATIENT_CLINIC_OR_DEPARTMENT_OTHER): Payer: Self-pay | Admitting: Internal Medicine

## 2024-02-08 DIAGNOSIS — Z13820 Encounter for screening for osteoporosis: Secondary | ICD-10-CM
